# Patient Record
Sex: Female | Born: 1985
Health system: Southern US, Community
[De-identification: ages and names within clinical notes are randomized; demographics above are authoritative.]

## PROBLEM LIST (undated history)

## (undated) DIAGNOSIS — O139 Gestational [pregnancy-induced] hypertension without significant proteinuria, unspecified trimester: Secondary | ICD-10-CM

## (undated) DIAGNOSIS — F319 Bipolar disorder, unspecified: Secondary | ICD-10-CM

## (undated) DIAGNOSIS — D649 Anemia, unspecified: Secondary | ICD-10-CM

## (undated) DIAGNOSIS — IMO0002 Reserved for concepts with insufficient information to code with codable children: Secondary | ICD-10-CM

## (undated) DIAGNOSIS — N946 Dysmenorrhea, unspecified: Secondary | ICD-10-CM

## (undated) DIAGNOSIS — F99 Mental disorder, not otherwise specified: Secondary | ICD-10-CM

## (undated) DIAGNOSIS — O88219 Thromboembolism in pregnancy, unspecified trimester: Secondary | ICD-10-CM

## (undated) DIAGNOSIS — I2699 Other pulmonary embolism without acute cor pulmonale: Secondary | ICD-10-CM

## (undated) DIAGNOSIS — R87629 Unspecified abnormal cytological findings in specimens from vagina: Secondary | ICD-10-CM

## (undated) DIAGNOSIS — R102 Pelvic and perineal pain: Secondary | ICD-10-CM

## (undated) DIAGNOSIS — E039 Hypothyroidism, unspecified: Secondary | ICD-10-CM

## (undated) DIAGNOSIS — N920 Excessive and frequent menstruation with regular cycle: Secondary | ICD-10-CM

## (undated) DIAGNOSIS — N390 Urinary tract infection, site not specified: Secondary | ICD-10-CM

## (undated) HISTORY — DX: Reserved for concepts with insufficient information to code with codable children: IMO0002

## (undated) HISTORY — DX: Pelvic and perineal pain: R10.2

## (undated) HISTORY — DX: Unspecified abnormal cytological findings in specimens from vagina: R87.629

## (undated) HISTORY — PX: NO PAST SURGERIES: SHX2092

## (undated) HISTORY — DX: Dysmenorrhea, unspecified: N94.6

## (undated) HISTORY — DX: Excessive and frequent menstruation with regular cycle: N92.0

---
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
## 2006-03-19 ENCOUNTER — Encounter (INDEPENDENT_AMBULATORY_CARE_PROVIDER_SITE_OTHER): Payer: Self-pay | Admitting: Specialist

## 2006-03-19 ENCOUNTER — Ambulatory Visit (HOSPITAL_BASED_OUTPATIENT_CLINIC_OR_DEPARTMENT_OTHER): Admission: RE | Admit: 2006-03-19 | Discharge: 2006-03-19 | Payer: Self-pay | Admitting: Otolaryngology

## 2006-03-21 ENCOUNTER — Emergency Department (HOSPITAL_COMMUNITY): Admission: EM | Admit: 2006-03-21 | Discharge: 2006-03-21 | Payer: Self-pay | Admitting: Emergency Medicine

## 2007-06-02 ENCOUNTER — Emergency Department (HOSPITAL_COMMUNITY): Admission: EM | Admit: 2007-06-02 | Discharge: 2007-06-02 | Payer: Self-pay | Admitting: Emergency Medicine

## 2010-01-28 ENCOUNTER — Emergency Department: Payer: Self-pay | Admitting: Emergency Medicine

## 2010-08-26 NOTE — H&P (Signed)
NAME:  BRYNLEI, KLAUSNER               ACCOUNT NO.:  [id number]   MEDICAL RECORD NO.:  [id number]          PATIENT TYPE:  OBV   LOCATION:  A318                          FACILITY:  [facility]   PHYSICIAN:  Tesfaye D. Felecia Shelling, MD   DATE OF BIRTH:  05/30/1985   DATE OF ADMISSION:  01/03/2005  DATE OF DISCHARGE:  LH                                HISTORY & PHYSICAL   CHIEF COMPLAINT:  Rectal bleeding.   HISTORY OF PRESENT ILLNESS:  This is a 25 year old female with no  significant past medical history who came to the office with above  complaint.  Patient had abdominal discomfort and watery diarrhea for the  last 3 days.  Initially patient had abdominal discomfort which was followed  with recurrent watery diarrhea.  The diarrhea stopped since yesterday.  Patient developed rectal bleeding since today.  The blood is not mixed with  stool.  She had several times this morning.  She came to the office where  she was evaluated and rectal examination showed fresh blood on examining  finger.  Patient was admitted for 24 hour observation and GI evaluation.   REVIEW OF SYSTEMS:  Patient feels sometimes dizzy and generally weak.  Her  appetite is decreased.  She has abdominal discomfort.  No nausea, vomiting,  fever, chills, cough, chest pain, dysuria, urgency or frequency of  urination.   PAST MEDICAL HISTORY:  No significant past medical illness.   MEDICATIONS:  Patient is only on birth control pill.   SOCIAL HISTORY:  Patient is a high [employer].  No history of alcohol,  tobacco, or substance abuse.   PHYSICAL EXAMINATION:  GENERAL:  Patient is alert, awake and comfortable.  VITALS:  Blood pressure 126/76, pulse 84, respiratory rate 20, temperature  98.6 degrees Fahrenheit.  HEENT:  Pupils are equally reactive.  NECK:  Supple.  CHEST:  Clear lung fields.  Good air entry.  Cardiovascular system first and  second heart sound heard.  No murmur, no gallop.  ABDOMEN:  Soft and lax, bowel sounds  positive.  No mass, no organomegaly.  RECTAL:  Patient has a normal sphincter.  There is bright red blood on  examining finger.  No mass.  EXTREMITIES:  No leg edema.   LABORATORIES ON ADMISSION:  CBC:  WBC 6.9, hemoglobin 11.5, hematocrit 34.4,  platelets 302.  Sodium 137, potassium 3.4, chloride 106, carbon dioxide 25,  glucose 130, BUN 10, creatinine 1, bilirubin 0.3, alkaline phosphatase 59,  AST 19, ALT 19, total protein 6.6, albumin 3.2, and calcium 8.7.   ASSESSMENT:  This is a 25 year old female patient with no significant past  medical history admitted with rectal bleeding.  Patient has also recurrent  watery diarrhea which has stopped today.  Her hemoglobin and hematocrit is  almost within the normal limits.  She has mild hypokalemia.   PLAN:  We will keep the patient on observation for 24 hours.  We will repeat  CBC in the morning.  We will do GI consult.  We will slowly hydrate the  patient with D-5 1/2-normal at 75 mL/hr.  We will  start her on Protonix 40  mg IV piggyback daily.      Tesfaye D. Felecia Shelling, MD  Electronically Signed     TDF/MEDQ  D:  01/03/2005  T:  01/03/2005  Job:  [id number]

## 2010-08-26 NOTE — Consult Note (Signed)
Megan Suarez, Megan Suarez               ACCOUNT NO.:  [id number]   MEDICAL RECORD NO.:  [id number]          PATIENT TYPE:  OBV   LOCATION:  A318                          FACILITY:  [facility]   PHYSICIAN:  Lionel December, M.D.    DATE OF BIRTH:  19-Jan-1986   DATE OF CONSULTATION:  01/03/2005  DATE OF DISCHARGE:                                   CONSULTATION   PHYSICIAN REQUESTING CONSULTATION:  Tesfaye D. Felecia Shelling, MD   REASON FOR CONSULTATION:  Hematochezia.   HISTORY OF PRESENT ILLNESS:  The patient is a 25 year old African-American  female who presented to Dr. Letitia Neri office today with 3 episodes of  hematochezia this morning.  She was admitted observation for further  evaluation.  She states that she started her menstrual cycle Saturday.  Sunday and Monday she had several loose watery stools.  Today she had a soft  bowel movement and noted bright red blood on the toilet tissue.  As she was  having menstrual cycle, she thought this may have explained it.  However,  she has had 2 more bowel movements with blood in the toilet and on the  tissue even though she was using tampon.  She notes with this bleeding she  has low abdominal pain.  Denies any fever or chills or ill contacts.  No one  around her has been ill.  She has not been on any antibiotics lately.  She  has not traveled abroad.  She uses ibuprofen p.r.n. menstrual cramps.  Denies any nausea or vomiting, melena, anorexia.  Her weight has been  stable.  She takes birth control pills to try to regulate her menstrual  cycles.   Hemoglobin 11.5, hematocrit 34.3, MCV 81.3.  Potassium 3.4.  Albumin 3.2.   MEDICATIONS AT HOME:  Birth control pill daily and ibuprofen p.r.n.  menstrual cramps.   ALLERGIES:  No known drug allergies.   PAST MEDICAL HISTORY:  Negative for chronic illnesses.   PAST SURGICAL HISTORY:  Tonsillectomy.   FAMILY HISTORY:  Negative for chronic GI illnesses, colorectal cancer, or  inflammatory bowel disease.   SOCIAL HISTORY:  She is a [employer] attorney.  She has one brother.  She denies  any tobacco or alcohol use.   REVIEW OF SYSTEMS:  See HPI for GI.  CARDIOPULMONARY:  Denies any chest pain  or shortness of breath.  She had a syncopal episode about 4 weeks ago in  which EMS was notified.  She did not seek any other medical attention.   PHYSICAL EXAMINATION:  VITAL SIGNS:  Height 5 feet 5 inches, weight 145.5.  Temperature 98.9, pulse 96, respirations 20, blood pressure 132/77.  GENERAL:  Pleasant, well-nourished, well-developed black female in no acute  distress.  SKIN: Warm and dry, no jaundice.  HEENT:  Conjunctivae pink.  Sclerae nonicteric.  Oropharyngeal mucosa moist  and pink.  No lesions, erythema, or exudate.  NECK:  No lymphadenopathy or thyromegaly.  CHEST: Lungs are clear to auscultation.  CARDIAC:  Regular rate and rhythm.  Normal S1 and S2.  No murmurs, rubs, or  gallops.  ABDOMEN:  Positive bowel  sounds.  Soft, nontender, nondistended.  No  organomegaly or masses.  No rebound tenderness or guarding.  No abdominal  bruits or hernias.  RECTAL:  Examination deferred, performed by Dr. Felecia Shelling earlier today.  Found  to have fresh blood per rectum per patient.  EXTREMITIES:  No edema.   LABORATORY DATA:  As in HPI.  In addition, white count 5900, platelets  302,000.  Sodium 137, BUN 10, creatinine 1, glucose 130.  Total bilirubin  0.3, alkaline phosphatase 59, AST 19, ALT 10, albumin 3.2, calcium 8.7.   IMPRESSION:  The patient is a 25 year old female with 3 episodes of fresh  blood per rectum today associated with abdominal pain.  She did have  diarrhea for about 2 days preceding this and is also in her menstrual cycle.  Differential diagnoses include infectious colitis versus inflammatory bowel  disease versus bleeding from a benign anorectal source.  She is also noted  to have a mild microcytic anemia which may be due to chronic blood loss due  to her menses.    RECOMMENDATIONS:  1.  Follow hemoglobin and hematocrit.  2.  Stool studies including C. difficile, culture, O&P, and fecal wbc's.  3.  Further recommendations to follow.      Tana Coast, P.A.      Lionel December, M.D.  Electronically Signed    LL/MEDQ  D:  01/03/2005  T:  01/03/2005  Job:  [id number]   cc:   Tesfaye D. Felecia Shelling, MD  Fax: [phone]

## 2010-08-26 NOTE — Op Note (Signed)
Megan Suarez, Megan Suarez               ACCOUNT NO.:  [id number]   MEDICAL RECORD NO.:  [id number]          PATIENT TYPE:  AMB   LOCATION:  DSC                          FACILITY:  [facility]   PHYSICIAN:  Jefry H. Pollyann Kennedy, MD     DATE OF BIRTH:  1985/11/16   DATE OF PROCEDURE:  03/19/2006  DATE OF DISCHARGE:                               OPERATIVE REPORT   PREOPERATIVE DIAGNOSIS:  Chronic tonsillitis.   POSTOPERATIVE DIAGNOSIS:  Chronic tonsillitis.   PROCEDURE:  Tonsillectomy.   SURGEON:  Pollyann Kennedy.   ANESTHESIA:  General endotracheal anesthesia was used.   COMPLICATIONS:  None.   FINDINGS:  Enlarged tonsils without any pathologic findings today.  The  nasopharynx was clear of lymphoid tissue.   REFERRING PHYSICIAN:  Dr. Felecia Shelling.   BLOOD LOSS:  None.   HISTORY:  A 25 year old with a longstanding history of chronic and  recurrent tonsillopharyngitis.  Risks, benefits, alternatives and  complications of the procedure were explained to the patient, seemed to  understand and agreed to surgery.   DESCRIPTION OF PROCEDURE:  The patient was taken to the operating room,  placed on the operating table in supine position.  Following induction  of general endotracheal anesthesia the table was turned and the patient  was draped in a standard fashion.  The Crowe-Davis mouth gag was  inserted into the oral cavity, used to retract the tongue and mandible  attached to the Mayo stand.  Inspection of the palate revealed no  evidence of a submucosal cleft or shortening of the soft palate.  Red  rubber catheter was inserted into the right side of the nose, withdrawn  through the mouth and used to retract the soft palate and uvula.  Indirect exam of the nasopharynx was performed and no abnormal tissue  was identified.  Tonsillectomy was performed using electrocautery  dissection carefully dissecting avascular plane between the capsule the  constrictor muscles.  Spot cautery was used for completion of  hemostasis.  The tonsils were together for pathologic evaluation.  The  pharynx was suctioned of blood and secretions, irrigated with a saline  solution and an orogastric tube was used to aspirate the contents of the  stomach.  The patient was then awakened, extubated and transferred to  recovery in stable condition.      Jefry H. Pollyann Kennedy, MD  Electronically Signed     JHR/MEDQ  D:  03/19/2006  T:  03/20/2006  Job:  [id number]   cc:   Tesfaye D. Felecia Shelling, MD

## 2010-08-26 NOTE — Op Note (Signed)
Megan Suarez, Megan Suarez               ACCOUNT NO.:  [id number]   MEDICAL RECORD NO.:  [id number]          PATIENT TYPE:  OBV   LOCATION:  A318                          FACILITY:  [facility]   PHYSICIAN:  Lionel December, M.D.    DATE OF BIRTH:  05-22-1985   DATE OF PROCEDURE:  01/04/2005  DATE OF DISCHARGE:  01/04/2005                                 OPERATIVE REPORT   PROCEDURE:  Colonoscopy with terminal ileoscopy.   Nakeya is a 25 year old Afro-American female who presents with acute onset  of rectal bleeding. She had self-limiting diarrhea for two days prior to  this. She is undergoing diagnostic examination. Her H&H this morning was  11.4 and yesterday was 11.5. Procedure risks were reviewed with the patient  as well as the parents, and informed consent was obtained from her mother,  Ms. Nicholes Rough.   PREMEDICATION:  Demerol 50 mg IV, Versed 5 mg IV in divided dose.   FINDINGS:  Procedure performed in endoscopy suite. The patient's vital signs  and O2 saturation were monitored during the procedure and remained stable.  The patient was placed in left lateral position and rectal examination  performed. No abnormality noted on external or digital exam. Olympus  videoscope was placed in rectum and advanced under vision into sigmoid colon  and beyond. Preparation was excellent. Scope was passed to the cecum which  was identified by ileocecal valve and appendiceal orifice. Pictures taken  for the record. Scope was passed to TI. Distal 5-6 cm of mucosa revealed  loss of vascularity, hyperemia with multiple red spots as well as friability  and tiny erosions. The mucosa proximal to this segment revealed some tiny  nodules with erosions. Pictures taken for the record followed by biopsy.  Mucosa of the colon was examined on the way out and was normal throughout.  Rectal mucosa was normal. Scope was retroflexed to examine anorectal  junction which was unremarkable. Endoscope was straightened and  withdrawn.  The patient tolerated the procedure well.   FINAL DIAGNOSIS:  Mild terminal or focal ileitis, possibly an infectious  process rather than Crohn's disease.   RECOMMENDATIONS:  1.  Would advance the diet to regular diet.  2.  Levsin SL t.i.d.   As discussed with Dr. Felecia Shelling, the patient should be able to go home this  afternoon, and I will be following up on the biopsy results and further  recommendations.      Lionel December, M.D.  Electronically Signed    NR/MEDQ  D:  01/04/2005  T:  01/05/2005  Job:  [id number]

## 2010-12-30 LAB — URINE CULTURE
Colony Count: NO GROWTH
Culture: NO GROWTH

## 2010-12-30 LAB — I-STAT 8, (EC8 V) (CONVERTED LAB)
Bicarbonate: 24.4 — ABNORMAL HIGH
Glucose, Bld: 118 — ABNORMAL HIGH
HCT: 39
Hemoglobin: 13.3
Potassium: 3.3 — ABNORMAL LOW
TCO2: 26
pH, Ven: 7.336 — ABNORMAL HIGH

## 2010-12-30 LAB — DIFFERENTIAL
Basophils Absolute: 0
Lymphocytes Relative: 25
Lymphs Abs: 1.7
Monocytes Absolute: 0.3
Monocytes Relative: 4

## 2010-12-30 LAB — URINALYSIS, ROUTINE W REFLEX MICROSCOPIC
Bilirubin Urine: NEGATIVE
Glucose, UA: NEGATIVE
Hgb urine dipstick: NEGATIVE
Ketones, ur: 15 — AB
Protein, ur: NEGATIVE
Specific Gravity, Urine: 1.018
Urobilinogen, UA: 0.2

## 2010-12-30 LAB — RAPID URINE DRUG SCREEN, HOSP PERFORMED
Benzodiazepines: POSITIVE — AB
Cocaine: NOT DETECTED
Opiates: NOT DETECTED
Tetrahydrocannabinol: NOT DETECTED

## 2010-12-30 LAB — ETHANOL: Alcohol, Ethyl (B): 218 — ABNORMAL HIGH

## 2010-12-30 LAB — PREGNANCY, URINE: Preg Test, Ur: NEGATIVE

## 2010-12-30 LAB — CBC
MCV: 83.4
WBC: 7

## 2010-12-30 LAB — URINE MICROSCOPIC-ADD ON

## 2010-12-30 LAB — POCT I-STAT CREATININE: Creatinine, Ser: 1.1

## 2011-04-11 DIAGNOSIS — F319 Bipolar disorder, unspecified: Secondary | ICD-10-CM

## 2011-04-11 HISTORY — DX: Bipolar disorder, unspecified: F31.9

## 2012-07-17 ENCOUNTER — Ambulatory Visit: Payer: Self-pay | Admitting: Obstetrics & Gynecology

## 2012-08-15 ENCOUNTER — Ambulatory Visit: Payer: Self-pay | Admitting: Obstetrics & Gynecology

## 2014-04-10 DIAGNOSIS — O88219 Thromboembolism in pregnancy, unspecified trimester: Secondary | ICD-10-CM

## 2014-04-10 HISTORY — DX: Thromboembolism in pregnancy, unspecified trimester: O88.219

## 2014-06-30 ENCOUNTER — Encounter: Payer: Self-pay | Admitting: Women's Health

## 2014-10-15 ENCOUNTER — Encounter: Payer: Self-pay | Admitting: Adult Health

## 2014-10-15 ENCOUNTER — Ambulatory Visit (INDEPENDENT_AMBULATORY_CARE_PROVIDER_SITE_OTHER): Payer: 59 | Admitting: Adult Health

## 2014-10-15 VITALS — BP 134/90 | HR 80 | Ht 66.0 in | Wt 198.0 lb

## 2014-10-15 DIAGNOSIS — N941 Dyspareunia: Secondary | ICD-10-CM

## 2014-10-15 DIAGNOSIS — IMO0002 Reserved for concepts with insufficient information to code with codable children: Secondary | ICD-10-CM

## 2014-10-15 DIAGNOSIS — R102 Pelvic and perineal pain: Secondary | ICD-10-CM

## 2014-10-15 DIAGNOSIS — Z3202 Encounter for pregnancy test, result negative: Secondary | ICD-10-CM

## 2014-10-15 DIAGNOSIS — N946 Dysmenorrhea, unspecified: Secondary | ICD-10-CM

## 2014-10-15 DIAGNOSIS — N920 Excessive and frequent menstruation with regular cycle: Secondary | ICD-10-CM | POA: Diagnosis not present

## 2014-10-15 HISTORY — DX: Pelvic and perineal pain: R10.2

## 2014-10-15 HISTORY — DX: Excessive and frequent menstruation with regular cycle: N92.0

## 2014-10-15 HISTORY — DX: Dysmenorrhea, unspecified: N94.6

## 2014-10-15 HISTORY — DX: Reserved for concepts with insufficient information to code with codable children: IMO0002

## 2014-10-15 LAB — POCT URINALYSIS DIPSTICK

## 2014-10-15 LAB — POCT URINE PREGNANCY: PREG TEST UR: NEGATIVE

## 2014-10-15 MED ORDER — NORETHIN ACE-ETH ESTRAD-FE 1-20 MG-MCG(24) PO CHEW: 1.0000 | CHEWABLE_TABLET | Freq: Every day | ORAL | Status: DC

## 2014-10-15 NOTE — Progress Notes (Signed)
Subjective:     Patient ID: Megan Suarez, female   DOB: [birth year], 29 y.o.   MRN: [id number]  HPI Megan Suarez is a 29 year old black female in complaining of stomach/pelvic pain that radiates to back, is worse at ovulation and around period, but may hurt anytime.She says periods last 5-7 days and are heavy 3-4 days and has clots, has pain with sex sometimes.She said she started her period at age 29 and they were fine til about 4621 and has had discomfort since, has used OCs in past and was better.Has missed work due to pain.Sometimes BMs hurt but denies diarrhea or constipation.She says she wonders if cyst or endometriosis.She does use condoms.  Review of Systems  Patient denies any headaches, hearing loss, fatigue, blurred vision, shortness of breath, chest pain, a problems with bowel movements, urination.  No joint pain or mood swings.See HPI for positives.  Reviewed past medical,surgical, social and family history. Reviewed medications and allergies.     Objective:   Physical Exam BP 134/90 mmHg  Pulse 80  Ht 5\' 6"  (1.676 m)  Wt 198 lb (89.812 kg)  BMI 31.97 kg/m2  LMP 10/12/2014 BP recheck 128/80 UPT negative, urine 3+ blood and trace protein, Skin warm and dry. Neck: mid line trachea, normal thyroid, good ROM, no lymphadenopathy noted. Lungs: clear to ausculation bilaterally. Cardiovascular: regular rate and rhythm.Abdomen soft, no massed, tender just below navel. Pelvic: external genitalia is normal in appearance no lesions, vagina: period blood without odor,urethra has no lesions or masses noted, cervix:smooth, uterus: normal size, shape and contour, mildly tender, no masses felt, adnexa: no masses, mildly tenderness noted. Bladder is non tender and no masses felt. GC/CHL obtained.    Discussed will get US to assess and start her on OCs now and she agrees.  Assessment:     Pelvic pain Menorrhagia Dysmenorrhea Dyspareunia     Plan:     Check CBC,CMP,TSH and GC/CHL Return in 1  week for gyn US and then see me 3-4 days later for pap and physical Review handout on pelvic pain, menorrhagia and dyspareunia Rx minastrin disp 1 pack take 1 daily with 11 refills, given 1 pack to start today, lot [id number] A exp 6/17 Use condoms

## 2014-10-15 NOTE — Patient Instructions (Signed)
Dyspareunia Dyspareunia is pain during sexual intercourse. It is most common in women, but it also happens in men.  CAUSES  Female The pain from this condition is usually felt when anything is put into the vagina, but any part of the genitals may cause pain during sex. Even sitting or wearing pants can cause pain. Sometimes, a cause cannot be found. Some causes of pain during intercourse are:  Infections of the skin around the vagina.  Vaginal infections, such as a yeast, bacterial, or viral infection.  Vaginismus. This is the inability to have anything put in the vagina even when the woman wants it to happen. There is an automatic muscle contraction and pain. The pain of the muscle contraction can be so severe that intercourse is impossible.  Allergic reaction from spermicides, semen, condoms, scented tampons, soaps, douches, and vaginal sprays.  A fluid-filled sac (cyst) on the Bartholin or Skene glands, located at the opening of the vagina.  Scar tissue in the vagina from a surgically enlarged opening (episiotomy) or tearing after delivering a baby.  Vaginal dryness. This is more common in menopause. The normal secretions of the vagina are decreased. Changes in estrogen levels and increased difficulty becoming aroused can cause painful sex. Vaginal dryness can also happen when taking birth control pills.  Thinning of the tissue (atrophy) of the vulva and vagina. This makes the area thinner, smaller, unable to stretch to accommodate a penis, and prone to infection and tearing.  Vulvar vestibulitis or vestibulodynia.This is a condition that causes pain involving the area around the entrance to the vagina.The most common cause in young women is birth control pills.Women with low estrogen levels (postmenopausal women) may also experience this.Other causes include allergic reactions, too many nerve endings, skin conditions, and pelvic muscles that cannot relax.  Vulvar dermatoses. This  includes skin conditions such as lichen sclerosus and lichen planus.  Lack of foreplay to lubricate the vagina. This can cause vaginal dryness.  Noncancerous tumors (fibroids) in the uterus.  Uterus lining tissue growing outside the uterus (endometriosis).  Pregnancy that starts in the fallopian tube (tubal pregnancy).  Pregnancy or breastfeeding your baby. This can cause vaginal dryness.  A tilting or prolapse of the uterus. Prolapse is when weak and stretched muscles around the uterus allow it to fall into the vagina.  Problems with the ovaries, cysts, or scar tissue. This may be worse with certain sexual positions.  Previous surgeries causing adhesions or scar tissue in the vagina or pelvis.  Bladder and intestinal problems.  Psychological problems (such as depression or anxiety). This may make pain worse.  Negative attitudes about sex, experiencing rape, sexual assault, and misinformation about sex. These issues are often related to some types of pain.  Previous pelvic infection, causing scar tissue in the pelvis and on the female organs.  Cyst or tumor on the ovary.  Cancer of the female organs.  Certain medicines.  Medical problems such as diabetes, arthritis, or thyroid disease. Female In men, there are many physical causes of sexual discomfort. Some causes of pain during intercourse are:  Infections of the prostate, bladder, or seminal vesicles. This can cause pain after ejaculation.  An inflamed bladder (interstitial cystitis). This may cause pain from ejaculation.  Gonorrheal infections. This may cause pain during ejaculation.  An inflamed urethra (urethritis) or inflamed prostate (prostatitis). This can make genital stimulation painful or uncomfortable.  Deformities of the penis, such as Peyronie's disease.  A tight foreskin.  Cancer of the female organs.    Psychological problems. This may make pain worse. DIAGNOSIS   Your caregiver will take a history and  have you describe where the pain is located (outside the vagina, in the vagina, in the pelvis). You may be asked when you experience pain, such as with penetration or with thrusting.  Following this, your caregiver will do a physical exam. Let your caregiver know if the exam is too painful.  During the final part of the female exam, your caregiver will feel your uterus and ovaries with one hand on the abdomen and one finger in your vagina. This is a pelvic exam.  Blood tests, a Pap test, cultures for infection, an ultrasound test, and X-rays may be done. You may need to see a specialist for female problems (gynecologist).  Your caregiver may do a CT scan, MRI, or laparoscopy. Laparoscopy is a procedure to look into the pelvis with a lighted tube, through a cut (incision) in the abdomen. TREATMENT  Your caregiver can help you determine the best course of treatment. Sometimes, more testing is done. Continue with the suggested testing until your caregiver feels sure about your diagnosis and how to treat it. Sometimes, it is difficult to find the reason for the pain. The search for the cause and treatment can be frustrating. Treatment often takes several weeks to a few months before you notice any improvement. You may also need to avoid sexual activity until symptoms improve.Continuing to have sex when it hurts can delay healing and actually make the problem worse. The treatment depends on the cause of the pain. Treatment may include:  Medicines such as antibiotics, vaginal or skin creams, hormones, or antidepressants.  Minor or major surgery.  Psychological counseling or group therapy.  Kegel exercises and vaginal dilators to help certain cases of vaginismus (spasms). Do this only if recommended by your caregiver.Kegel exercises can make some problems worse.  Applying lubrication as recommended by your caregiver if you have dryness.  Sex therapy for you and your sex partner. It is common for  the pain to continue after the reason for the pain has been treated. Some reasons for this include a conditioned response. This means the person having the pain becomes so familiar with the pain that the pain continues as a response, even though the cause is removed. Sex therapy can help with this problem. HOME CARE INSTRUCTIONS   Follow your caregiver's instructions about taking medicines, tests, counseling, and follow-up treatment.  Do not use scented tampons, douches, vaginal sprays, or soaps.  Use water-based lubricants for dryness. Oil lubricants can cause irritation.  Do not use spermicides or condoms that irritate you.  Openly discuss with your partner your sexual experience, your desires, foreplay, and different sexual positions for a more comfortable and enjoyable sexual relationship.  Join group sessions for therapy, if needed.  Practice safe sex at all times.  Empty your bladder before having intercourse.  Try different positions during sexual intercourse.  Take over-the-counter pain medicine recommended by your caregiver before having sexual intercourse.  Do not wear pantyhose. Knee-high and thigh-high hose are okay.  Avoid scrubbing your vulva with a washcloth. Wash the area gently and pat dry with a towel. SEEK MEDICAL CARE IF:   You develop vaginal bleeding after sexual intercourse.  You develop a lump at the opening of your vagina, even if it is not painful.  You have abnormal vaginal discharge.  You have vaginal dryness.  You have itching or irritation of the vulva or vagina.  You   develop a rash or reaction to your medicine. SEEK IMMEDIATE MEDICAL CARE IF:   You develop severe abdominal pain during or shortly after sexual intercourse. You could have a ruptured ovarian cyst or ruptured tubal pregnancy.  You have a fever.  You have painful or bloody urination.  You have painful sexual intercourse, and you never had it before.  You pass out after having  sexual intercourse. Document Released: 04/16/2007 Document Revised: 06/19/2011 Document Reviewed: 06/27/2010 [facility] Patient Information 2015 [place], [place]. This information is not intended to replace advice given to you by your health care provider. Make sure you discuss any questions you have with your health care provider. Menorrhagia Menorrhagia is the medical term for when your menstrual periods are heavy or last longer than usual. With menorrhagia, every period you have may cause enough blood loss and cramping that you are unable to maintain your usual activities. CAUSES  In some cases, the cause of heavy periods is unknown, but a number of conditions may cause menorrhagia. Common causes include:  A problem with the hormone-producing thyroid gland (hypothyroid).  Noncancerous growths in the uterus (polyps or fibroids).  An imbalance of the estrogen and progesterone hormones.  One of your ovaries not releasing an egg during one or more months.  Side effects of having an intrauterine device (IUD).  Side effects of some medicines, such as anti-inflammatory medicines or blood thinners.  A bleeding disorder that stops your blood from clotting normally. SIGNS AND SYMPTOMS  During a normal period, bleeding lasts between 4 and 8 days. Signs that your periods are too heavy include:  You routinely have to change your pad or tampon every 1 or 2 hours because it is completely soaked.  You pass blood clots larger than 1 inch (2.5 cm) in size.  You have bleeding for more than 7 days.  You need to use pads and tampons at the same time because of heavy bleeding.  You need to wake up to change your pads or tampons during the night.  You have symptoms of anemia, such as tiredness, fatigue, or shortness of breath. DIAGNOSIS  Your health care provider will perform a physical exam and ask you questions about your symptoms and menstrual history. Other tests may be ordered based on what the  health care provider finds during the exam. These tests can include:  Blood tests. Blood tests are used to check if you are pregnant or have hormonal changes, a bleeding or thyroid disorder, low iron levels (anemia), or other problems.  Endometrial biopsy. Your health care provider takes a sample of tissue from the inside of your uterus to be examined under a microscope.  Pelvic ultrasound. This test uses sound waves to make a picture of your uterus, ovaries, and vagina. The pictures can show if you have fibroids or other growths.  Hysteroscopy. For this test, your health care provider will use a small telescope to look inside your uterus. Based on the results of your initial tests, your health care provider may recommend further testing. TREATMENT  Treatment may not be needed. If it is needed, your health care provider may recommend treatment with one or more medicines first. If these do not reduce bleeding enough, a surgical treatment might be an option. The best treatment for you will depend on:   Whether you need to prevent pregnancy.  Your desire to have children in the future.  The cause and severity of your bleeding.  Your opinion and personal preference.  Medicines for  menorrhagia may include:  Birth control methods that use hormones. These include the pill, skin patch, vaginal ring, shots that you get every 3 months, hormonal IUD, and implant. These treatments reduce bleeding during your menstrual period.  Medicines that thicken blood and slow bleeding.  Medicines that reduce swelling, such as ibuprofen.  Medicines that contain a synthetic hormone called progestin.   Medicines that make the ovaries stop working for a short time.  You may need surgical treatment for menorrhagia if the medicines are unsuccessful. Treatment options include:  Dilation and curettage (D&C). In this procedure, your health care provider opens (dilates) your cervix and then scrapes or suctions  tissue from the lining of your uterus to reduce menstrual bleeding.  Operative hysteroscopy. This procedure uses a tiny tube with a light (hysteroscope) to view your uterine cavity and can help in the surgical removal of a polyp that may be causing heavy periods.  Endometrial ablation. Through various techniques, your health care provider permanently destroys the entire lining of your uterus (endometrium). After endometrial ablation, most women have little or no menstrual flow. Endometrial ablation reduces your ability to become pregnant.  Endometrial resection. This surgical procedure uses an electrosurgical wire loop to remove the lining of the uterus. This procedure also reduces your ability to become pregnant.  Hysterectomy. Surgical removal of the uterus and cervix is a permanent procedure that stops menstrual periods. Pregnancy is not possible after a hysterectomy. This procedure requires anesthesia and hospitalization. HOME CARE INSTRUCTIONS   Only take over-the-counter or prescription medicines as directed by your health care provider. Take prescribed medicines exactly as directed. Do not change or switch medicines without consulting your health care provider.  Take any prescribed iron pills exactly as directed by your health care provider. Long-term heavy bleeding may result in low iron levels. Iron pills help replace the iron your body lost from heavy bleeding. Iron may cause constipation. If this becomes a problem, increase the bran, fruits, and roughage in your diet.  Do not take aspirin or medicines that contain aspirin 1 week before or during your menstrual period. Aspirin may make the bleeding worse.  If you need to change your sanitary pad or tampon more than once every 2 hours, stay in bed and rest as much as possible until the bleeding stops.  Eat well-balanced meals. Eat foods high in iron. Examples are leafy green vegetables, meat, liver, eggs, and whole grain breads and  cereals. Do not try to lose weight until the abnormal bleeding has stopped and your blood iron level is back to normal. SEEK MEDICAL CARE IF:   You soak through a pad or tampon every 1 or 2 hours, and this happens every time you have a period.  You need to use pads and tampons at the same time because you are bleeding so much.  You need to change your pad or tampon during the night.  You have a period that lasts for more than 8 days.  You pass clots bigger than 1 inch wide.  You have irregular periods that happen more or less often than once a month.  You feel dizzy or faint.  You feel very weak or tired.  You feel short of breath or feel your heart is beating too fast when you exercise.  You have nausea and vomiting or diarrhea while you are taking your medicine.  You have any problems that may be related to the medicine you are taking. SEEK IMMEDIATE MEDICAL CARE IF:  You soak through 4 or more pads or tampons in 2 hours.  You have any bleeding while you are pregnant. MAKE SURE YOU:   Understand these instructions.  Will watch your condition.  Will get help right away if you are not doing well or get worse. Document Released: 03/27/2005 Document Revised: 04/01/2013 Document Reviewed: 09/15/2012 [facility] Patient Information 2015 [place]. This information is not intended to replace advice given to you by your health care provider. Make sure you discuss any questions you have with your health care provider. Pelvic Pain Female pelvic pain can be caused by many different things and start from a variety of places. Pelvic pain refers to pain that is located in the lower half of the abdomen and between your hips. The pain may occur over a short period of time (acute) or may be reoccurring (chronic). The cause of pelvic pain may be related to disorders affecting the female reproductive organs (gynecologic), but it may also be related to the bladder, kidney stones, an  intestinal complication, or muscle or skeletal problems. Getting help right away for pelvic pain is important, especially if there has been severe, sharp, or a sudden onset of unusual pain. It is also important to get help right away because some types of pelvic pain can be life threatening.  CAUSES  Below are only some of the causes of pelvic pain. The causes of pelvic pain can be in one of several categories.   Gynecologic.  Pelvic inflammatory disease.  Sexually transmitted infection.  Ovarian cyst or a twisted ovarian ligament (ovarian torsion).  Uterine lining that grows outside the uterus (endometriosis).  Fibroids, cysts, or tumors.  Ovulation.  Pregnancy.  Pregnancy that occurs outside the uterus (ectopic pregnancy).  Miscarriage.  Labor.  Abruption of the placenta or ruptured uterus.  Infection.  Uterine infection (endometritis).  Bladder infection.  Diverticulitis.  Miscarriage related to a uterine infection (septic abortion).  Bladder.  Inflammation of the bladder (cystitis).  Kidney stone(s).  Gastrointestinal.  Constipation.  Diverticulitis.  Neurologic.  Trauma.  Feeling pelvic pain because of mental or emotional causes (psychosomatic).  Cancers of the bowel or pelvis. EVALUATION  Your caregiver will want to take a careful history of your concerns. This includes recent changes in your health, a careful gynecologic history of your periods (menses), and a sexual history. Obtaining your family history and medical history is also important. Your caregiver may suggest a pelvic exam. A pelvic exam will help identify the location and severity of the pain. It also helps in the evaluation of which organ system may be involved. In order to identify the cause of the pelvic pain and be properly treated, your caregiver may order tests. These tests may include:   A pregnancy test.  Pelvic ultrasonography.  An X-ray exam of the abdomen.  A urinalysis  or evaluation of vaginal discharge.  Blood tests. HOME CARE INSTRUCTIONS   Only take over-the-counter or prescription medicines for pain, discomfort, or fever as directed by your caregiver.   Rest as directed by your caregiver.   Eat a balanced diet.   Drink enough fluids to make your urine clear or pale yellow, or as directed.   Avoid sexual intercourse if it causes pain.   Apply warm or cold compresses to the lower abdomen depending on which one helps the pain.   Avoid stressful situations.   Keep a journal of your pelvic pain. Write down when it started, where the pain is located, and if there  are things that seem to be associated with the pain, such as food or your menstrual cycle.  Follow up with your caregiver as directed.  SEEK MEDICAL CARE IF:  Your medicine does not help your pain.  You have abnormal vaginal discharge. SEEK IMMEDIATE MEDICAL CARE IF:   You have heavy bleeding from the vagina.   Your pelvic pain increases.   You feel light-headed or faint.   You have chills.   You have pain with urination or blood in your urine.   You have uncontrolled diarrhea or vomiting.   You have a fever or persistent symptoms for more than 3 days.  You have a fever and your symptoms suddenly get worse.   You are being physically or sexually abused.  MAKE SURE YOU:  Understand these instructions.  Will watch your condition.  Will get help if you are not doing well or get worse. Document Released: 02/22/2004 Document Revised: 08/11/2013 Document Reviewed: 07/17/2011 [facility] Patient Information 2015 [place], [place]. This information is not intended to replace advice given to you by your health care provider. Make sure you discuss any questions you have with your health care provider. Return in 1 week for [place] then in 3-4 days for pap and physical  Start pills today and use condoms

## 2014-10-16 ENCOUNTER — Telehealth: Payer: Self-pay | Admitting: Adult Health

## 2014-10-16 ENCOUNTER — Encounter: Payer: Self-pay | Admitting: Adult Health

## 2014-10-16 LAB — COMPREHENSIVE METABOLIC PANEL
A/G RATIO: 1.6 (ref 1.1–2.5)
ALBUMIN: 4.2 g/dL (ref 3.5–5.5)
ALT: 11 IU/L (ref 0–32)
AST: 17 IU/L (ref 0–40)
Alkaline Phosphatase: 67 IU/L (ref 39–117)
BUN/Creatinine Ratio: 15 (ref 8–20)
BUN: 13 mg/dL (ref 6–20)
CO2: 23 mmol/L (ref 18–29)
Calcium: 9.2 mg/dL (ref 8.7–10.2)
Chloride: 103 mmol/L (ref 97–108)
Creatinine, Ser: 0.87 mg/dL (ref 0.57–1.00)
GFR, EST AFRICAN AMERICAN: 106 mL/min/{1.73_m2} (ref >59)
GFR, EST NON AFRICAN AMERICAN: 92 mL/min/{1.73_m2} (ref >59)
GLOBULIN, TOTAL: 2.7 g/dL (ref 1.5–4.5)
GLUCOSE: 106 mg/dL — AB (ref 65–99)
Potassium: 4.6 mmol/L (ref 3.5–5.2)
Sodium: 142 mmol/L (ref 134–144)
TOTAL PROTEIN: 6.9 g/dL (ref 6.0–8.5)

## 2014-10-16 LAB — CBC
Hematocrit: 39.5 % (ref 34.0–46.6)
Hemoglobin: 12.6 g/dL (ref 11.1–15.9)
MCH: 27.3 pg (ref 26.6–33.0)
MCHC: 31.9 g/dL (ref 31.5–35.7)
MCV: 86 fL (ref 79–97)
PLATELETS: 255 10*3/uL (ref 150–379)
RBC: 4.61 x10E6/uL (ref 3.77–5.28)
RDW: 14.3 % (ref 12.3–15.4)
WBC: 4.1 10*3/uL (ref 3.4–10.8)

## 2014-10-16 LAB — GC/CHLAMYDIA PROBE AMP
Chlamydia trachomatis, NAA: NEGATIVE
Neisseria gonorrhoeae by PCR: NEGATIVE

## 2014-10-16 LAB — TSH: TSH: 0.893 u[IU]/mL (ref 0.450–4.500)

## 2014-10-16 MED ORDER — IBUPROFEN 800 MG PO TABS: 800.0000 mg | ORAL_TABLET | Freq: Three times a day (TID) | ORAL | Status: DC | PRN

## 2014-10-16 NOTE — Telephone Encounter (Signed)
Having pain, started OCs yesterday, will rx motrin 800 mg 1 every 8 hours prn pain #60 with 1 refill

## 2014-10-16 NOTE — Telephone Encounter (Signed)
Left message to call me.

## 2014-10-21 ENCOUNTER — Encounter: Payer: Self-pay | Admitting: Advanced Practice Midwife

## 2014-10-21 ENCOUNTER — Telehealth: Payer: Self-pay | Admitting: Adult Health

## 2014-10-21 ENCOUNTER — Ambulatory Visit (INDEPENDENT_AMBULATORY_CARE_PROVIDER_SITE_OTHER): Payer: 59

## 2014-10-21 DIAGNOSIS — R102 Pelvic and perineal pain: Secondary | ICD-10-CM

## 2014-10-21 DIAGNOSIS — N920 Excessive and frequent menstruation with regular cycle: Secondary | ICD-10-CM

## 2014-10-21 DIAGNOSIS — N946 Dysmenorrhea, unspecified: Secondary | ICD-10-CM | POA: Diagnosis not present

## 2014-10-21 NOTE — Progress Notes (Signed)
US PELVIC TV/TA : normal anteverted uterus,simple cyst rt ov 2.5 x 2.4 x 1.8cm,normal lt ov,small amount of simple cul de sac fluid,eec 5.804mm

## 2014-10-21 NOTE — Telephone Encounter (Signed)
Pt  Aware of US and that it is normal has simple cysts right ovary, keep taking OCs and will see next week for pap and physical

## 2014-10-28 ENCOUNTER — Other Ambulatory Visit: Payer: 59 | Admitting: Adult Health

## 2014-11-05 ENCOUNTER — Other Ambulatory Visit: Payer: 59 | Admitting: Adult Health

## 2014-11-10 ENCOUNTER — Telehealth: Payer: Self-pay | Admitting: Adult Health

## 2014-11-10 NOTE — Telephone Encounter (Signed)
Spoke with pt. Pt wanted birth control rx transferred to [facility]. I advised to call the [facility] and have them transfer rx. Pt voiced understanding. JSY

## 2014-11-10 NOTE — Telephone Encounter (Signed)
Spoke with pt. Pt is on her 1st pack of Minastrin. She has been on her period x 9 days. I advised it can be normal to have irregular periods when you start a new birth control. I advised to continue taking pill and hopefully period will get regulated soon. Advised to let us know if bleeding continues through 2nd pack or if bleeding gets heavy. Pt voiced understanding. JSY

## 2014-11-24 ENCOUNTER — Other Ambulatory Visit: Payer: 59 | Admitting: Adult Health

## 2014-11-24 ENCOUNTER — Encounter: Payer: Self-pay | Admitting: Adult Health

## 2015-01-06 ENCOUNTER — Encounter (HOSPITAL_COMMUNITY): Payer: Self-pay

## 2015-01-06 ENCOUNTER — Emergency Department (HOSPITAL_COMMUNITY): Payer: 59

## 2015-01-06 ENCOUNTER — Emergency Department (HOSPITAL_COMMUNITY)
Admission: EM | Admit: 2015-01-06 | Discharge: 2015-01-06 | Disposition: A | Discharge status: Home / Self Care | Payer: 59 | Attending: Emergency Medicine | Admitting: Emergency Medicine

## 2015-01-06 DIAGNOSIS — Z87891 Personal history of nicotine dependence: Secondary | ICD-10-CM | POA: Diagnosis not present

## 2015-01-06 DIAGNOSIS — G8929 Other chronic pain: Secondary | ICD-10-CM | POA: Diagnosis not present

## 2015-01-06 DIAGNOSIS — M25572 Pain in left ankle and joints of left foot: Secondary | ICD-10-CM | POA: Insufficient documentation

## 2015-01-06 DIAGNOSIS — Z8742 Personal history of other diseases of the female genital tract: Secondary | ICD-10-CM | POA: Diagnosis not present

## 2015-01-06 DIAGNOSIS — Z793 Long term (current) use of hormonal contraceptives: Secondary | ICD-10-CM | POA: Insufficient documentation

## 2015-01-06 NOTE — ED Notes (Signed)
Pt c/o pain in left ankle x 2 days.  Describes as a "tearing" sensation.  Denies injury.   Pt says took  ibuprofen prior to arrival.

## 2015-01-06 NOTE — Discharge Instructions (Signed)
Ankle Pain Ankle pain is a common symptom. The bones, cartilage, tendons, and muscles of the ankle joint perform a lot of work each day. The ankle joint holds your body weight and allows you to move around. Ankle pain can occur on either side or back of 1 or both ankles. Ankle pain may be sharp and burning or dull and aching. There may be tenderness, stiffness, redness, or warmth around the ankle. The pain occurs more often when a person walks or puts pressure on the ankle. CAUSES  There are many reasons ankle pain can develop. It is important to work with your caregiver to identify the cause since many conditions can impact the bones, cartilage, muscles, and tendons. Causes for ankle pain include:  Injury, including a break (fracture), sprain, or strain often due to a fall, sports, or a high-impact activity.  Swelling (inflammation) of a tendon (tendonitis).  Achilles tendon rupture.  Ankle instability after repeated sprains and strains.  Poor foot alignment.  Pressure on a nerve (tarsal tunnel syndrome).  Arthritis in the ankle or the lining of the ankle.  Crystal formation in the ankle (gout or pseudogout). DIAGNOSIS  A diagnosis is based on your medical history, your symptoms, results of your physical exam, and results of diagnostic tests. Diagnostic tests may include X-ray exams or a computerized magnetic scan (magnetic resonance imaging, MRI). TREATMENT  Treatment will depend on the cause of your ankle pain and may include:  Keeping pressure off the ankle and limiting activities.  Using crutches or other walking support (a cane or brace).  Using rest, ice, compression, and elevation.  Participating in physical therapy or home exercises.  Wearing shoe inserts or special shoes.  Losing weight.  Taking medications to reduce pain or swelling or receiving an injection.  Undergoing surgery. HOME CARE INSTRUCTIONS   Only take over-the-counter or prescription medicines for  pain, discomfort, or fever as directed by your caregiver.  Put ice on the injured area.  Put ice in a plastic bag.  Place a towel between your skin and the bag.  Leave the ice on for 15-20 minutes at a time, 03-04 times a day.  Keep your leg raised (elevated) when possible to lessen swelling.  Avoid activities that cause ankle pain.  Follow specific exercises as directed by your caregiver.  Record how often you have ankle pain, the location of the pain, and what it feels like. This information may be helpful to you and your caregiver.  Ask your caregiver about returning to work or sports and whether you should drive.  Follow up with your caregiver for further examination, therapy, or testing as directed. SEEK MEDICAL CARE IF:   Pain or swelling continues or worsens beyond 1 week.  You have an oral temperature above 102 F (38.9 C).  You are feeling unwell or have chills.  You are having an increasingly difficult time with walking.  You have loss of sensation or other new symptoms.  You have questions or concerns. MAKE SURE YOU:   Understand these instructions.  Will watch your condition.  Will get help right away if you are not doing well or get worse. Document Released: 09/14/2009 Document Revised: 06/19/2011 Document Reviewed: 09/14/2009 [facility] Patient Information 2015 [place], [place]. This information is not intended to replace advice given to you by your health care provider. Make sure you discuss any questions you have with your health care provider.   Your xrays are negative today for any bony injury or abnormality.  Continue using ibuprofen as this helped relieve your pain. I recommend applying ice packs to your ankle throughout the day.   Call Dr. Romeo Apple for further evaluation if you symptoms persist.

## 2015-01-07 NOTE — ED Provider Notes (Signed)
CSN: [id number]     Arrival date & time 01/06/15  1136 History   First MD Initiated Contact with Patient 01/06/15 1155     Chief Complaint  Patient presents with  . Ankle Pain     (Consider location/radiation/quality/duration/timing/severity/associated sxs/prior Treatment) The history is provided by the patient.   Megan Suarez is a 29 y.o. female with no significant past medical history and no recognized injury to her left ankle presenting with a pain in the medial left ankle for the past 2 days.  She describes a sharp "tearing" sensation at the medial left ankle which is worsened when she first wakes in the morning with ambulation, but improves after she has been awake and moving for awhile.  The pain radiates to her lower medial leg.  She denies swelling, redness, injury.  She took ibuprofen 800 mg today which has significantly improved her pain.  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  She has been seen in the past by an orthopedist for being "flat footed" as a teenager and used to wear special inserts which were helpful.     Past Medical History  Diagnosis Date  . Vaginal Pap smear, abnormal   . Pelvic pain in female 10/15/2014  . Dyspareunia 10/15/2014  . Menorrhagia with regular cycle 10/15/2014  . Dysmenorrhea 10/15/2014   History reviewed. No pertinent past surgical history. No family history on file. Social History  Substance Use Topics  . Smoking status: Former Smoker    Types: Cigarettes  . Smokeless tobacco: Never Used  . Alcohol Use: Yes     Comment: once-twice weekly   OB History    Gravida Para Term Preterm AB TAB SAB Ectopic Multiple Living       Review of Systems  Constitutional: Negative for fever.  Musculoskeletal: Positive for arthralgias. Negative for myalgias and joint swelling.  Skin:  Negative for color change, rash and wound.  Neurological: Negative for weakness and numbness.      Allergies  Review of patient's allergies indicates no known allergies.  Home Medications   Prior to Admission medications   Medication Sig Start Date End Date Taking? Authorizing Provider  Norethin Ace-Eth Estrad-FE (MINASTRIN 24 FE) 1-20 MG-MCG(24) CHEW Chew 1 tablet by mouth daily. 10/15/14  Yes Adline Potter, NP  ibuprofen (ADVIL,MOTRIN) 800 MG tablet Take 1 tablet (800 mg total) by mouth every 8 (eight) hours as needed. Patient not taking: Reported on 01/06/2015 10/16/14   Adline Potter, NP   BP 154/99 mmHg  Pulse 78  Temp(Src) 99.1 F (37.3 C) (Oral)  Resp 20  Ht  (1.676 m)  Wt 200 lb (90.719 kg)  BMI 32.30 kg/m2  SpO2 99%  LMP 01/04/2015 Physical Exam  Constitutional: She appears well-developed and well-nourished.  HENT:  Head: Atraumatic.  Neck: Normal range of motion.  Cardiovascular:  Pulses equal bilaterally  Musculoskeletal: She exhibits tenderness. She exhibits no edema.       Left ankle: She exhibits normal range of motion, no swelling, no ecchymosis, no deformity and normal pulse. Tenderness. Medial malleolus tenderness found. Achilles tendon normal.  Bilateral pes planus.  Neurological: She is alert. She has normal strength. She displays normal reflexes. No sensory deficit.  Skin: Skin is warm and dry.  Psychiatric:  She has a normal mood and affect.    ED Course  Procedures (including critical care time) Labs Review Labs Reviewed - No data to display  Imaging Review Dg Ankle 2 Views Left  01/06/2015   CLINICAL DATA:  Pain medial side of LEFT ankle for 2 days.  EXAM: LEFT ANKLE - 2 VIEW  COMPARISON:  None.  FINDINGS: Ankle mortise intact. The talar dome is normal. No malleolar fracture. The calcaneus is normal.  IMPRESSION: No fracture or dislocation.   Electronically Signed   By: Genevive Bi M.D.   On: 01/06/2015 13:46   I have personally  reviewed and evaluated these images and lab results as part of my medical decision-making.   EKG Interpretation None      MDM   Final diagnoses:  Ankle pain, chronic, left     Radiological studies were viewed, interpreted and considered during the medical decision making and disposition process. I agree with radiologists reading.  Results were also discussed with patient. She was placed in an aso which did offer some comfort. Advised to consider adding shoe insert providing better arch support and/or f/u with ortho for further eval of this problem.  Suspect she may have ligament strain secondary to increased standing and ambulation/overuse with her new job.  She was advised to continue ibuprofen, f/u as needed, referral given.     Burgess Amor, PA-C 01/07/15 1610  Burgess Amor, PA-C 01/07/15 1708  Blane Ohara, MD 01/12/15 [phone]

## 2015-01-21 ENCOUNTER — Encounter: Payer: Self-pay | Admitting: Orthopedic Surgery

## 2015-01-21 ENCOUNTER — Ambulatory Visit (INDEPENDENT_AMBULATORY_CARE_PROVIDER_SITE_OTHER): Payer: 59 | Admitting: Orthopedic Surgery

## 2015-01-21 VITALS — BP 125/81 | Ht 66.0 in | Wt 200.0 lb

## 2015-01-21 DIAGNOSIS — S93402A Sprain of unspecified ligament of left ankle, initial encounter: Secondary | ICD-10-CM | POA: Diagnosis not present

## 2015-01-21 NOTE — Progress Notes (Signed)
Patient ID: Megan Suarez, female   DOB: June 09, 1985, 29 y.o.   MRN: [id number]  Chief Complaint  Patient presents with  . Ankle Injury    er follow up left ankle pain, DOI 01/07/15    HPI Megan Suarez is a 29 y.o. female.  The patient got a bed something popped in her left ankle and she had pain and she's had it for 2 weeks. She complains of 5 out of 10 constant pain. She complains of sharp throbbing aching radiating pain in the left foot and ankle primarily on the medial side with some mild lateral pain and she also complains of stiffness  Review of systems she says sometimes she feels some numbness and tingling in the foot. She's taken Motrin worn ASO brace she said that didn't help but she has improved over the 2 week.  X-rays were taken they were normal  She presents for definitive management  Review of systems stiffness numbness tingling depression nausea constipation ankle leg swelling palpitations ringing of the ears and fatigue the other systems reviewed were normal  Review of Systems Review of Systems  Past Medical History  Diagnosis Date  . Vaginal Pap smear, abnormal   . Pelvic pain in female 10/15/2014  . Dyspareunia 10/15/2014  . Menorrhagia with regular cycle 10/15/2014  . Dysmenorrhea 10/15/2014    No past surgical history on file.  No family history on file.  Social History Social History  Substance Use Topics  . Smoking status: Former Smoker    Types: Cigarettes  . Smokeless tobacco: Never Used  . Alcohol Use: Yes     Comment: once-twice weekly    No Known Allergies  Current Outpatient Prescriptions  Medication Sig Dispense Refill  . ibuprofen (ADVIL,MOTRIN) 800 MG tablet Take 1 tablet (800 mg total) by mouth every 8 (eight) hours as needed. 60 tablet 1  . lamoTRIgine (LAMICTAL) 25 MG tablet Take 25 mg by mouth daily.    . Norethin Ace-Eth Estrad-FE (MINASTRIN 24 FE) 1-20 MG-MCG(24) CHEW Chew 1 tablet by mouth daily. 28 tablet 11   No current  facility-administered medications for this visit.       Physical Exam Physical Exam Blood pressure 125/81, height 5\' 6"  (1.676 m), weight 200 lb (90.719 kg), last menstrual period 01/04/2015. Appearance, there are no abnormalities in terms of appearance the patient was well-developed and well-nourished. The grooming and hygiene were normal.  Mental status orientation, there was normal alertness and orientation Mood pleasant Ambulatory status I watched her walk and she has a slight limp favoring the left side Examination of the right ankle no swelling normal range of motion no tenderness normal stability neurovascular exam intact Inspection of the left ankle reveal medial and lateral. Ankle tenderness Range of motion normal plantar flexion inversion eversion decreased dorsiflexion Tests for stability 1+ firm endpoint anterior drawer Motor strength  normal eversion Skin warm dry and intact without laceration or ulceration or erythema Neurologic examination normal sensation Vascular examination normal pulses with warm extremity and normal capillary refill      Data Reviewed X-rays independently interpreted as normal with no fracture  Assessment  Ankle sprain   Plan  I taught her how to do ankle stretching exercises. Return to work on the 17th. Continue Motrin. Follow-up as needed.

## 2015-01-21 NOTE — Patient Instructions (Signed)
Return to work 01/25/15  Home exercises for ankle 15 times each  Continue Ibuprofen

## 2015-03-11 ENCOUNTER — Encounter (HOSPITAL_COMMUNITY): Payer: Self-pay | Admitting: Emergency Medicine

## 2015-03-11 ENCOUNTER — Emergency Department (HOSPITAL_COMMUNITY): Payer: Self-pay

## 2015-03-11 ENCOUNTER — Observation Stay (HOSPITAL_COMMUNITY)
Admission: EM | Admit: 2015-03-11 | Discharge: 2015-03-12 | Disposition: A | Discharge status: Home / Self Care | Payer: Self-pay | Attending: Internal Medicine | Admitting: Internal Medicine

## 2015-03-11 DIAGNOSIS — N920 Excessive and frequent menstruation with regular cycle: Secondary | ICD-10-CM | POA: Insufficient documentation

## 2015-03-11 DIAGNOSIS — Z79899 Other long term (current) drug therapy: Secondary | ICD-10-CM | POA: Insufficient documentation

## 2015-03-11 DIAGNOSIS — R000 Tachycardia, unspecified: Secondary | ICD-10-CM | POA: Insufficient documentation

## 2015-03-11 DIAGNOSIS — I2699 Other pulmonary embolism without acute cor pulmonale: Principal | ICD-10-CM | POA: Diagnosis present

## 2015-03-11 DIAGNOSIS — Z87891 Personal history of nicotine dependence: Secondary | ICD-10-CM | POA: Insufficient documentation

## 2015-03-11 DIAGNOSIS — R109 Unspecified abdominal pain: Secondary | ICD-10-CM | POA: Insufficient documentation

## 2015-03-11 DIAGNOSIS — N946 Dysmenorrhea, unspecified: Secondary | ICD-10-CM | POA: Insufficient documentation

## 2015-03-11 NOTE — ED Notes (Signed)
Pt has worse SOB with talking, pain with deep breathes

## 2015-03-11 NOTE — ED Notes (Signed)
Pt complaining of left side pain, now radiating up into left shoulder, pt has increased SOB

## 2015-03-11 NOTE — ED Notes (Signed)
Pt states 2 days ago she began having pain in the left side/ribcage area. States she felt as though it was trapped gas at that time. Pt endorses that the pain worsens at night after work. States she has recently started a new more sedentary job as well. Pt states some shortness of breath has been associated with the pain tonight.

## 2015-03-12 ENCOUNTER — Emergency Department (HOSPITAL_COMMUNITY): Payer: 59

## 2015-03-12 DIAGNOSIS — R0602 Shortness of breath: Secondary | ICD-10-CM

## 2015-03-12 DIAGNOSIS — I2699 Other pulmonary embolism without acute cor pulmonale: Principal | ICD-10-CM | POA: Diagnosis present

## 2015-03-12 DIAGNOSIS — R071 Chest pain on breathing: Secondary | ICD-10-CM

## 2015-03-12 LAB — URINE MICROSCOPIC-ADD ON

## 2015-03-12 LAB — URINALYSIS, ROUTINE W REFLEX MICROSCOPIC
Bilirubin Urine: NEGATIVE
GLUCOSE, UA: NEGATIVE mg/dL
KETONES UR: NEGATIVE mg/dL
LEUKOCYTES UA: NEGATIVE
Nitrite: NEGATIVE
PROTEIN: NEGATIVE mg/dL
Specific Gravity, Urine: 1.01 (ref 1.005–1.030)
pH: 6 (ref 5.0–8.0)

## 2015-03-12 LAB — CBC WITH DIFFERENTIAL/PLATELET
BASOS ABS: 0 10*3/uL (ref 0.0–0.1)
BASOS PCT: 1 %
EOS ABS: 0.1 10*3/uL (ref 0.0–0.7)
Eosinophils Relative: 1 %
HCT: 38.1 % (ref 36.0–46.0)
Hemoglobin: 12.3 g/dL (ref 12.0–15.0)
Lymphocytes Relative: 25 %
Lymphs Abs: 1.8 10*3/uL (ref 0.7–4.0)
MCH: 28.2 pg (ref 26.0–34.0)
MCHC: 32.3 g/dL (ref 30.0–36.0)
MCV: 87.4 fL (ref 78.0–100.0)
MONO ABS: 0.6 10*3/uL (ref 0.1–1.0)
MONOS PCT: 9 %
NEUTROS PCT: 64 %
Neutro Abs: 4.8 10*3/uL (ref 1.7–7.7)
Platelets: 228 10*3/uL (ref 150–400)
RBC: 4.36 MIL/uL (ref 3.87–5.11)
RDW: 14 % (ref 11.5–15.5)
WBC: 7.3 10*3/uL (ref 4.0–10.5)

## 2015-03-12 LAB — POC URINE PREG, ED: PREG TEST UR: NEGATIVE

## 2015-03-12 LAB — COMPREHENSIVE METABOLIC PANEL
ALBUMIN: 3.7 g/dL (ref 3.5–5.0)
ALT: 17 U/L (ref 14–54)
ANION GAP: 7 (ref 5–15)
AST: 17 U/L (ref 15–41)
Alkaline Phosphatase: 61 U/L (ref 38–126)
BUN: 17 mg/dL (ref 6–20)
CHLORIDE: 102 mmol/L (ref 101–111)
CO2: 28 mmol/L (ref 22–32)
Calcium: 8.9 mg/dL (ref 8.9–10.3)
Creatinine, Ser: 1.06 mg/dL — ABNORMAL HIGH (ref 0.44–1.00)
GFR calc Af Amer: >60 mL/min (ref >60)
GFR calc non Af Amer: >60 mL/min (ref >60)
GLUCOSE: 107 mg/dL — AB (ref 65–99)
POTASSIUM: 3.6 mmol/L (ref 3.5–5.1)
SODIUM: 137 mmol/L (ref 135–145)
Total Bilirubin: 0.7 mg/dL (ref 0.3–1.2)
Total Protein: 7.6 g/dL (ref 6.5–8.1)

## 2015-03-12 LAB — TROPONIN I: Troponin I: <0.03 ng/mL (ref <0.031)

## 2015-03-12 LAB — D-DIMER, QUANTITATIVE: D-Dimer, Quant: 1.12 ug/mL-FEU — ABNORMAL HIGH (ref 0.00–0.50)

## 2015-03-12 LAB — ANTITHROMBIN III: ANTITHROMB III FUNC: 110 % (ref 75–120)

## 2015-03-12 MED ORDER — ONDANSETRON HCL 4 MG/2ML IJ SOLN
4.0000 mg | Freq: Once | INTRAMUSCULAR | Status: AC
  Administered 2015-03-12: 4 mg via INTRAVENOUS
  Filled 2015-03-12: qty 2

## 2015-03-12 MED ORDER — SODIUM CHLORIDE 0.9 % IV SOLN: 250.0000 mL | INTRAVENOUS | Status: DC | PRN

## 2015-03-12 MED ORDER — ENOXAPARIN SODIUM 100 MG/ML ~~LOC~~ SOLN
SUBCUTANEOUS | Status: AC
  Filled 2015-03-12: qty 1

## 2015-03-12 MED ORDER — RIVAROXABAN (XARELTO) VTE STARTER PACK (15 & 20 MG): ORAL_TABLET | ORAL | Status: DC

## 2015-03-12 MED ORDER — RIVAROXABAN 20 MG PO TABS: 20.0000 mg | ORAL_TABLET | Freq: Every day | ORAL | Status: DC

## 2015-03-12 MED ORDER — SODIUM CHLORIDE 0.9 % IJ SOLN
3.0000 mL | Freq: Two times a day (BID) | INTRAMUSCULAR | Status: DC
  Administered 2015-03-12: 3 mL via INTRAVENOUS

## 2015-03-12 MED ORDER — FENTANYL CITRATE (PF) 100 MCG/2ML IJ SOLN
50.0000 ug | Freq: Once | INTRAMUSCULAR | Status: AC
  Administered 2015-03-12: 50 ug via INTRAVENOUS
  Filled 2015-03-12: qty 2

## 2015-03-12 MED ORDER — SODIUM CHLORIDE 0.9 % IJ SOLN: 3.0000 mL | Freq: Two times a day (BID) | INTRAMUSCULAR | Status: DC

## 2015-03-12 MED ORDER — HYDROCODONE-ACETAMINOPHEN 5-325 MG PO TABS
1.0000 | ORAL_TABLET | ORAL | Status: DC | PRN
  Administered 2015-03-12: 1 via ORAL
  Filled 2015-03-12: qty 1

## 2015-03-12 MED ORDER — SODIUM CHLORIDE 0.9 % IJ SOLN: 3.0000 mL | INTRAMUSCULAR | Status: DC | PRN

## 2015-03-12 MED ORDER — ENOXAPARIN SODIUM 100 MG/ML ~~LOC~~ SOLN
1.0000 mg/kg | Freq: Once | SUBCUTANEOUS | Status: AC
  Administered 2015-03-12: 95 mg via SUBCUTANEOUS
  Filled 2015-03-12: qty 1

## 2015-03-12 MED ORDER — ENOXAPARIN SODIUM 100 MG/ML ~~LOC~~ SOLN: 1.0000 mg/kg | Freq: Two times a day (BID) | SUBCUTANEOUS | Status: DC

## 2015-03-12 MED ORDER — IOHEXOL 350 MG/ML SOLN
100.0000 mL | Freq: Once | INTRAVENOUS | Status: AC | PRN
  Administered 2015-03-12: 100 mL via INTRAVENOUS

## 2015-03-12 NOTE — Progress Notes (Signed)
Discharged PT per MD order and protocol. Reviewed discharge teaching and handouts given. Prescriptions given and explained.  Pt verbalized understanding and left with all belongings. VSS. IV catheter D/C.  Patient wheeled down by staff member. Lesly Dukesachel J Everett, RN

## 2015-03-12 NOTE — H&P (Signed)
PCP:   No PCP Per Patient   Chief Complaint:  Chest pain  HPI: 29 yo female healthy comes in with 2 days of pleuritic chest pain persistent with associated sob.  No fevers, chills, no cough or hemoptysis.  No recent surgery or traveling.  Was on birth control, stopped taking it about 2 months ago.  No recent immobility.  No recent broken bones.  cta shows new PE.  No prior h/o VTE.  Pt unsure if she has health insurance as she just changed jobs.  Review of Systems:  Positive and negative as per HPI otherwise all other systems are negative  Past Medical History: Past Medical History  Diagnosis Date  . Vaginal Pap smear, abnormal   . Pelvic pain in female 10/15/2014  . Dyspareunia 10/15/2014  . Menorrhagia with regular cycle 10/15/2014  . Dysmenorrhea 10/15/2014   History reviewed. No pertinent past surgical history.  Medications: Prior to Admission medications   Medication Sig Start Date End Date Taking? Authorizing Provider  ibuprofen (ADVIL,MOTRIN) 800 MG tablet Take 1 tablet (800 mg total) by mouth every 8 (eight) hours as needed. 10/16/14   Adline PotterJennifer A Griffin, NP  lamoTRIgine (LAMICTAL) 25 MG tablet Take 25 mg by mouth daily.    Historical Provider, MD  Norethin Ace-Eth Estrad-FE (MINASTRIN 24 FE) 1-20 MG-MCG(24) CHEW Chew 1 tablet by mouth daily. 10/15/14   Adline PotterJennifer A Griffin, NP    Allergies:  No Known Allergies  Social History:  reports that she has quit smoking. Her smoking use included Cigarettes. She has never used smokeless tobacco. She reports that she drinks alcohol. She reports that she does not use illicit drugs.  Family History: No vte  Physical Exam: Filed Vitals:   03/12/15 0130 03/12/15 0200 03/12/15 0227 03/12/15 0230  BP: 138/83 142/91 142/91 132/78  Pulse: 91  88 87  Temp:      TempSrc:      Resp: 20  19 20   Height:      Weight:      SpO2: 98%  99% 98%   General appearance: alert, cooperative and no distress Head: Normocephalic, without obvious  abnormality, atraumatic Eyes: negative Nose: Nares normal. Septum midline. Mucosa normal. No drainage or sinus tenderness. Neck: no JVD and supple, symmetrical, trachea midline Lungs: clear to auscultation bilaterally Heart: regular rate and rhythm, S1, S2 normal, no murmur, click, rub or gallop Abdomen: soft, non-tender; bowel sounds normal; no masses,  no organomegaly Extremities: extremities normal, atraumatic, no cyanosis or edema Pulses: 2+ and symmetric Skin: Skin color, texture, turgor normal. No rashes or lesions Neurologic: Grossly normal    Labs on Admission:   Recent Labs  03/11/15 2315  NA 137  K 3.6  CL 102  CO2 28  GLUCOSE 107*  BUN 17  CREATININE 1.06*  CALCIUM 8.9    Recent Labs  03/11/15 2315  AST 17  ALT 17  ALKPHOS 61  BILITOT 0.7  PROT 7.6  ALBUMIN 3.7    Recent Labs  03/11/15 2315  WBC 7.3  NEUTROABS 4.8  HGB 12.3  HCT 38.1  MCV 87.4  PLT 228    Recent Labs  03/11/15 2315  TROPONINI <0.03    Radiological Exams on Admission: Dg Chest 2 View  03/11/2015  CLINICAL DATA:  Dyspnea and left chest pain radiating to the back. EXAM: CHEST  2 VIEW COMPARISON:  None. FINDINGS: The heart size and mediastinal contours are within normal limits. Both lungs are clear. The visualized skeletal structures are  unremarkable. IMPRESSION: No active cardiopulmonary disease. Electronically Signed   By: Ellery Plunk M.D.   On: 03/11/2015 23:55   Ct Angio Chest Pe W/cm &/or Wo Cm  03/12/2015  CLINICAL DATA:  Left-sided chest pain of 2 days duration EXAM: CT ANGIOGRAPHY CHEST WITH CONTRAST TECHNIQUE: Multidetector CT imaging of the chest was performed using the standard protocol during bolus administration of intravenous contrast. Multiplanar CT image reconstructions and MIPs were obtained to evaluate the vascular anatomy. CONTRAST:  OMNIPAQUE IOHEXOL 350 MG/ML SOLN COMPARISON:  Radiographs 03/11/2015 FINDINGS: Cardiovascular: There is acute pulmonary  embolism to the segmental arteries of both lower lobes. Overall the volume of pulmonary embolus is moderately low. There is no CT evidence of right heart strain. The thoracic aorta is normal in caliber and intact. Lungs: There is peripheral a based focal consolidation in the posterior basal segment of the left lower lobe. This could represent infarction, given the pulmonary embolus in the feeding artery. It could also represent infectious infiltrate, aspiration or atelectasis. The lungs are otherwise clear. Central airways: Patent Effusions: Trace left pleural effusion. Lymphadenopathy: None Esophagus: Unremarkable Upper abdomen: No significant abnormality Musculoskeletal: No significant abnormality Review of the MIP images confirms the above findings. IMPRESSION: The study is positive for acute pulmonary embolism. The emboli involve segmental arteries of both lower lobes. No CT evidence of right heart strain. These results were called by telephone at the time of interpretation on 03/12/2015 at 2:02 am to Dr. Lynelle Doctor , who verbally acknowledged these results. Electronically Signed   By: Ellery Plunk M.D.   On: 03/12/2015 02:03    Assessment/Plan  29 yo female with unprovoked new bilateral pulmonary emboli  Principal Problem:   Pulmonary embolism and infarction (HCC)- given full dose lovenox already.  Will need full hypercoagulable work up once treatment is finished.  Will need anticoagulation for at least 6 months.  No estrogen.  obs pt overnight in order to figure out what line of therapy she will be able to afford pending her health insurance status.    Active Problems:   Chest pain- as above, due to PE   SOB (shortness of breath)- due to PE  obs on tele.  Full code.  Almadelia Looman A 03/12/2015, 3:17 AM

## 2015-03-12 NOTE — ED Notes (Signed)
Pt. Walked around the nurses station with no dizziness and no short of breath. Pt O2 was 98%. Pt stated that she only feels short of breath when she walks to fast then she cant catch her breath.

## 2015-03-12 NOTE — Discharge Summary (Signed)
Physician Discharge Summary  Megan Suarez RUE:[id number] DOB: [birth year] DOA: 03/11/2015  PCP: No PCP Per Patient  Admit date: 03/11/2015 Discharge date: 03/12/2015  Time spent: 45 minutes  Recommendations for Outpatient Follow-up:  -Will be discharged home today. -Case management has provided with financial resources for Xarelto and for follow-up with new PCP.   Discharge Diagnoses:  Principal Problem:   Pulmonary embolism and infarction [facility]) Active Problems:   Chest pain   SOB (shortness of breath)   Discharge Condition: Stable and improved  Filed Weights   03/11/15 2227  Weight: 93.895 kg (207 lb)    History of present illness:  As per Dr. Onalee Suarez 03/12/15: 29 yo female healthy comes in with 2 days of pleuritic chest pain persistent with associated sob. No fevers, chills, no cough or hemoptysis. No recent surgery or traveling. Was on birth control, stopped taking it about 2 months ago. No recent immobility. No recent broken bones. cta shows new PE. No prior h/o VTE. Pt unsure if she has health insurance as she just changed jobs.  Hospital Course:   Acute pulmonary embolism -No oxygen requirements, no evidence of right heart strain on CT scan. -Hypercoagulable workup is pending at time of discharge and will need to be followed up on. -We'll be discharged on Xarelto which she will need to take for at least 6 months.  Procedures:  None   Consultations:  None  Discharge Instructions  Discharge Instructions    Increase activity slowly    Complete by:  As directed             Medication List    TAKE these medications        ibuprofen 800 MG tablet  Commonly known as:  ADVIL,MOTRIN  Take 1 tablet (800 mg total) by mouth every 8 (eight) hours as needed.     lamoTRIgine 25 MG tablet  Commonly known as:  LAMICTAL  Take 25 mg by mouth daily.     Rivaroxaban 15 & 20 MG Tbpk  Commonly known as:  XARELTO STARTER PACK  Take as directed on package:  Start with one  tablet by mouth twice a day with food. On Day 22, switch to one  tablet once a day with food.     rivaroxaban 20 MG Tabs tablet  Commonly known as:  XARELTO  Take 1 tablet (20 mg total) by mouth daily with supper.       No Known Allergies     Follow-up Information    Schedule an appointment as soon as possible for a visit in 2 weeks to follow up.   Why:  with new primary care provider       The results of significant diagnostics from this hospitalization (including imaging, microbiology, ancillary and laboratory) are listed below for reference.    Significant Diagnostic Studies: Dg Chest 2 View  03/11/2015  CLINICAL DATA:  Dyspnea and left chest pain radiating to the back. EXAM: CHEST  2 VIEW COMPARISON:  None. FINDINGS: The heart size and mediastinal contours are within normal limits. Both lungs are clear. The visualized skeletal structures are unremarkable. IMPRESSION: No active cardiopulmonary disease. Electronically Signed   By: Ellery Plunk M.D.   On: 03/11/2015 23:55   Ct Angio Chest Pe W/cm &/or Wo Cm  03/12/2015  CLINICAL DATA:  Left-sided chest pain of 2 days duration EXAM: CT ANGIOGRAPHY CHEST WITH CONTRAST TECHNIQUE: Multidetector CT imaging of the chest was performed using the standard protocol during bolus  administration of intravenous contrast. Multiplanar CT image reconstructions and MIPs were obtained to evaluate the vascular anatomy. CONTRAST:  100mL OMNIPAQUE IOHEXOL 350 MG/ML SOLN COMPARISON:  Radiographs 03/11/2015 FINDINGS: Cardiovascular: There is acute pulmonary embolism to the segmental arteries of both lower lobes. Overall the volume of pulmonary embolus is moderately low. There is no CT evidence of right heart strain. The thoracic aorta is normal in caliber and intact. Lungs: There is peripheral a based focal consolidation in the posterior basal segment of the left lower lobe. This could represent infarction, given the pulmonary embolus  in the feeding artery. It could also represent infectious infiltrate, aspiration or atelectasis. The lungs are otherwise clear. Central airways: Patent Effusions: Trace left pleural effusion. Lymphadenopathy: None Esophagus: Unremarkable Upper abdomen: No significant abnormality Musculoskeletal: No significant abnormality Review of the MIP images confirms the above findings. IMPRESSION: The study is positive for acute pulmonary embolism. The emboli involve segmental arteries of both lower lobes. No CT evidence of right heart strain. These results were called by telephone at the time of interpretation on 03/12/2015 at 2:02 am to Dr. Lynelle DoctorKnapp , who verbally acknowledged these results. Electronically Signed   By: Ellery Plunkaniel R Mitchell M.D.   On: 03/12/2015 02:03    Microbiology: No results found for this or any previous visit (from the past 240 hour(s)).   Labs: Basic Metabolic Panel:  Recent Labs Lab 03/11/15 2315  NA 137  K 3.6  CL 102  CO2 28  GLUCOSE 107*  BUN 17  CREATININE 1.06*  CALCIUM 8.9   Liver Function Tests:  Recent Labs Lab 03/11/15 2315  AST 17  ALT 17  ALKPHOS 61  BILITOT 0.7  PROT 7.6  ALBUMIN 3.7   No results for input(s): LIPASE, AMYLASE in the last 168 hours. No results for input(s): AMMONIA in the last 168 hours. CBC:  Recent Labs Lab 03/11/15 2315  WBC 7.3  NEUTROABS 4.8  HGB 12.3  HCT 38.1  MCV 87.4  PLT 228   Cardiac Enzymes:  Recent Labs Lab 03/11/15 2315  TROPONINI <0.03   BNP: BNP (last 3 results) No results for input(s): BNP in the last 8760 hours.  ProBNP (last 3 results) No results for input(s): PROBNP in the last 8760 hours.  CBG: No results for input(s): GLUCAP in the last 168 hours.     SignedChaya Jan:  HERNANDEZ Suarez,ESTELA  [facility] Pager: [phone] 03/12/2015, 10:52 AM

## 2015-03-12 NOTE — Discharge Instructions (Signed)
Rivaroxaban oral tablets What is this medicine? RIVAROXABAN (ri va ROX a ban) is an anticoagulant (blood thinner). It is used to treat blood clots in the lungs or in the veins. It is also used after knee or hip surgeries to prevent blood clots. It is also used to lower the chance of stroke in people with a medical condition called atrial fibrillation. This medicine may be used for other purposes; ask your health care provider or pharmacist if you have questions. What should I tell my health care provider before I take this medicine? They need to know if you have any of these conditions: -bleeding disorders -bleeding in the brain -blood in your stools (black or tarry stools) or if you have blood in your vomit -history of stomach bleeding -kidney disease -liver disease -low blood counts, like low white cell, platelet, or red cell counts -recent or planned spinal or epidural procedure -take medicines that treat or prevent blood clots -an unusual or allergic reaction to rivaroxaban, other medicines, foods, dyes, or preservatives -pregnant or trying to get pregnant -breast-feeding How should I use this medicine? Take this medicine by mouth with a glass of water. Follow the directions on the prescription label. Take your medicine at regular intervals. Do not take it more often than directed. Do not stop taking except on your doctor's advice. Stopping this medicine may increase your risk of a blood clot. Be sure to refill your prescription before you run out of medicine. If you are taking this medicine after hip or knee replacement surgery, take it with or without food. If you are taking this medicine for atrial fibrillation, take it with your evening meal. If you are taking this medicine to treat blood clots, take it with food at the same time each day. If you are unable to swallow your tablet, you may crush the tablet and mix it in applesauce. Then, immediately eat the applesauce. You should eat more  food right after you eat the applesauce containing the crushed tablet. Talk to your pediatrician regarding the use of this medicine in children. Special care may be needed. Overdosage: If you think you have taken too much of this medicine contact a poison control center or emergency room at once. NOTE: This medicine is only for you. Do not share this medicine with others. What if I miss a dose? If you take your medicine once a day and miss a dose, take the missed dose as soon as you remember. If you take your medicine twice a day and miss a dose, take the missed dose immediately. In this instance, 2 tablets may be taken at the same time. The next day you should take 1 tablet twice a day as directed. What may interact with this medicine? -aspirin and aspirin-like medicines -certain antibiotics like erythromycin, azithromycin, and clarithromycin -certain medicines for fungal infections like ketoconazole and itraconazole -certain medicines for irregular heart beat like amiodarone, quinidine, dronedarone -certain medicines for seizures like carbamazepine, phenytoin -certain medicines that treat or prevent blood clots like warfarin, enoxaparin, and dalteparin -conivaptan -diltiazem -felodipine -indinavir -lopinavir; ritonavir -NSAIDS, medicines for pain and inflammation, like ibuprofen or naproxen -ranolazine -rifampin -ritonavir -St. John's wort -verapamil This list may not describe all possible interactions. Give your health care provider a list of all the medicines, herbs, non-prescription drugs, or dietary supplements you use. Also tell them if you smoke, drink alcohol, or use illegal drugs. Some items may interact with your medicine. What should I watch for while using this   medicine? Visit your doctor or health care professional for regular checks on your progress. Your condition will be monitored carefully while you are receiving this medicine. Notify your doctor or health care  professional and seek emergency treatment if you develop breathing problems; changes in vision; chest pain; severe, sudden headache; pain, swelling, warmth in the leg; trouble speaking; sudden numbness or weakness of the face, arm, or leg. These can be signs that your condition has gotten worse. If you are going to have surgery, tell your doctor or health care professional that you are taking this medicine. Tell your health care professional that you use this medicine before you have a spinal or epidural procedure. Sometimes people who take this medicine have bleeding problems around the spine when they have a spinal or epidural procedure. This bleeding is very rare. If you have a spinal or epidural procedure while on this medicine, call your health care professional immediately if you have back pain, numbness or tingling (especially in your legs and feet), muscle weakness, paralysis, or loss of bladder or bowel control. Avoid sports and activities that might cause injury while you are using this medicine. Severe falls or injuries can cause unseen bleeding. Be careful when using sharp tools or knives. Consider using an electric razor. Take special care brushing or flossing your teeth. Report any injuries, bruising, or red spots on the skin to your doctor or health care professional. What side effects may I notice from receiving this medicine? Side effects that you should report to your doctor or health care professional as soon as possible: -allergic reactions like skin rash, itching or hives, swelling of the face, lips, or tongue -back pain -redness, blistering, peeling or loosening of the skin, including inside the mouth -signs and symptoms of bleeding such as bloody or black, tarry stools; red or dark-brown urine; spitting up blood or brown material that looks like coffee grounds; red spots on the skin; unusual bruising or bleeding from the eye, gums, or nose Side effects that usually do not require  medical attention (Report these to your doctor or health care professional if they continue or are bothersome.): -dizziness -muscle pain This list may not describe all possible side effects. Call your doctor for medical advice about side effects. You may report side effects to FDA at 1-800-FDA-1088. Where should I keep my medicine? Keep out of the reach of children. Store at room temperature between 15 and 30 degrees C (59 and 86 degrees F). Throw away any unused medicine after the expiration date. NOTE: This sheet is a summary. It may not cover all possible information. If you have questions about this medicine, talk to your doctor, pharmacist, or health care provider.    2016, Elsevier/Gold Standard. (2014-03-25 12:45:34)  

## 2015-03-12 NOTE — ED Provider Notes (Signed)
CSN: [id number]     Arrival date & time 03/11/15  2224 History   First MD Initiated Contact with Patient 03/12/15 0001     Chief Complaint  Patient presents with  . Shortness of Breath     (Consider location/radiation/quality/duration/timing/severity/associated sxs/prior Treatment) HPI   Megan Suarez is a 29 y.o. female who presents to the Emergency Department complaining of pain to her left chest wall, shoulder and upper back.  Pain onset was 2 days ago.  Today, she states the pain has increased and worse to her upper back area and associated with shortness of breath.  She states the pain seems worse with sitting and leaning forward.  She states nothing makes the pain better.  She also states she has been having chills.  She denies recent injury, fever, cold or cough, dysuria, N/V, recent travel or previous PE/DVT.  She does admit to a recent, more sedentary, job.     Past Medical History  Diagnosis Date  . Vaginal Pap smear, abnormal   . Pelvic pain in female 10/15/2014  . Dyspareunia 10/15/2014  . Menorrhagia with regular cycle 10/15/2014  . Dysmenorrhea 10/15/2014   History reviewed. No pertinent past surgical history. No family history on file. Social History  Substance Use Topics  . Smoking status: Former Smoker    Types: Cigarettes  . Smokeless tobacco: Never Used  . Alcohol Use: Yes     Comment: once-twice weekly   OB History    Gravida Para Term Preterm AB TAB SAB Ectopic Multiple Living       Review of Systems  Constitutional: Positive for fever. Negative for activity change and appetite change.  HENT: Negative for congestion, rhinorrhea, sore throat and trouble swallowing.   Respiratory: Positive for shortness of breath. Negative for cough, chest tightness, wheezing and stridor.   Cardiovascular: Positive for chest pain.  Gastrointestinal: Positive for abdominal pain. Negative for nausea, vomiting and diarrhea.  Genitourinary: Negative for  dysuria, urgency, frequency and flank pain.  Musculoskeletal: Positive for back pain (left upper back and shoulder pain).  Skin: Negative for rash.  Neurological: Negative for dizziness, syncope, weakness, light-headedness and numbness.      Allergies  Review of patient's allergies indicates no known allergies.  Home Medications   Prior to Admission medications   Medication Sig Start Date End Date Taking? Authorizing Provider  ibuprofen (ADVIL,MOTRIN) 800 MG tablet Take 1 tablet (800 mg total) by mouth every 8 (eight) hours as needed. 10/16/14   Adline Potter, NP  lamoTRIgine (LAMICTAL) 25 MG tablet Take 25 mg by mouth daily.    Historical Provider, MD  Norethin Ace-Eth Estrad-FE (MINASTRIN 24 FE) 1-20 MG-MCG(24) CHEW Chew 1 tablet by mouth daily. 10/15/14   Adline Potter, NP   BP 132/91 mmHg  Pulse 86  Temp(Src) 98.2 F (36.8 C) (Oral)  Resp 14  Ht  (1.676 m)  Wt 93.895 kg  BMI 33.43 kg/m2  SpO2 100%  LMP 03/04/2015 Physical Exam  Constitutional: She is oriented to person, place, and time. She appears well-developed and well-nourished.  Uncomfortable appearing  HENT:  Head: Normocephalic and atraumatic.  Mouth/Throat: Oropharynx is clear and moist.  Neck: Normal range of motion. Neck supple.  Cardiovascular: Regular rhythm and intact distal pulses.  Tachycardia present.   Pulmonary/Chest: Effort normal and breath sounds normal. No respiratory distress. She has no wheezes. She has no rales. She exhibits tenderness.  left chest wall  tender to palpation.  No crepitus or guarding  Abdominal: Soft.  Mild left upper abdominal tenderness w/o guarding or rebound tenderness.    Musculoskeletal: Normal range of motion. She exhibits no edema.  Lymphadenopathy:    She has no cervical adenopathy.  Neurological: She is alert and oriented to person, place, and time. She exhibits normal muscle tone. Coordination normal.  Skin: Skin is warm and dry. No rash noted.   Psychiatric: She has a normal mood and affect.  Nursing note and vitals reviewed.   ED Course  Procedures (including critical care time) Labs Review Labs Reviewed  COMPREHENSIVE METABOLIC PANEL - Abnormal; Notable for the following:    Glucose, Bld 107 (*)    Creatinine, Ser 1.06 (*)    All other components within normal limits  D-DIMER, QUANTITATIVE (NOT AT [facility]) - Abnormal; Notable for the following:    D-Dimer, Quant 1.12 (*)    All other components within normal limits  CBC WITH DIFFERENTIAL/PLATELET  TROPONIN I  URINALYSIS, ROUTINE W REFLEX MICROSCOPIC (NOT AT [facility])  POC URINE PREG, ED    Imaging Review Dg Chest 2 View  03/11/2015  CLINICAL DATA:  Dyspnea and left chest pain radiating to the back. EXAM: CHEST  2 VIEW COMPARISON:  None. FINDINGS: The heart size and mediastinal contours are within normal limits. Both lungs are clear. The visualized skeletal structures are unremarkable. IMPRESSION: No active cardiopulmonary disease. Electronically Signed   By: Ellery Plunkaniel R Mitchell M.D.   On: 03/11/2015 23:55   I have personally reviewed and evaluated these images and lab results as part of my medical decision-making.   EKG Interpretation   Date/Time:  Thursday March 11 2015 22:38:14 EST Ventricular Rate:  86 PR Interval:  167 QRS Duration: 85 QT Interval:  356 QTC Calculation: 426 R Axis:   78 Text Interpretation:  Sinus rhythm Confirmed by ZACKOWSKI  MD, SCOTT  ([id number]) on 03/11/2015 10:43:19 PM      MDM   Final diagnoses:  None    Pt with increasing left chest wall pain and dyspnea.  Pain worse tonight.  Mildly tachycardic.  Will check labs and CXR.  0110  Patient is feeling better after IV pain medications, appears more comfortable.  Discussed lab results, d-dimer elevated, will order CT angio  0130  End of shift, pt discussed with Dr. Devoria AlbeIva Knapp, who will review CT angio results and arrange dispo    Pauline Ausammy Tiernan Millikin, PA-C 03/12/15 0146  Devoria AlbeIva Knapp,  MD 03/12/15 [id number]  Devoria AlbeIva Knapp, MD 03/12/15 [phone]

## 2015-03-12 NOTE — Care Management Note (Signed)
Case Management Note  Patient Details  Name: Megan Suarez MRN: [id number] Date of Birth: [birth year]  Subjective/Objective:                  Pt is from home and ind with ADL's. Pt admitted with PE. Pt is employed but uninsured. Pt has been to Dr. Felecia ShellingFanta in the past. Dr. Letitia NeriFanta's office now will not see her due to her not having insurance.  Action/Plan: [facility]. Pt [facility], per her choice of providers. Pt given application and brochure, Pt given voucher for xeralto and instructed to contact the financial assistance number for assistance with cost of medication. No further CM needs.   Expected Discharge Date:    03/12/2015              Expected Discharge Plan:  Home/Self Care  In-House Referral:  Financial Counselor  Discharge planning Services  CM Consult, Follow-up appt scheduled, [facility]  Post Acute Care Choice:  NA Choice offered to:  NA  DME Arranged:    DME Agency:     HH Arranged:    HH Agency:     Status of Service:  Completed, signed off  Medicare Important Message Given:    Date Medicare IM Given:    Medicare IM give by:    Date Additional Medicare IM Given:    Additional Medicare Important Message give by:     If discussed at Long Length of Stay Meetings, dates discussed:    Additional Comments:  Malcolm MetroChildress, Ronae Noell Demske, RN 03/12/2015, 1:46 PM

## 2015-03-14 LAB — HOMOCYSTEINE: Homocysteine: 7 umol/L (ref 0.0–15.0)

## 2015-03-15 LAB — PROTEIN C, TOTAL: Protein C, Total: 88 % (ref 60–150)

## 2015-03-15 LAB — PROTEIN S ACTIVITY: Protein S Activity: 118 % (ref 63–140)

## 2015-03-15 LAB — PROTEIN C ACTIVITY: Protein C Activity: 82 % (ref 73–180)

## 2015-03-15 LAB — PROTEIN S, TOTAL: Protein S Ag, Total: 141 % (ref 60–150)

## 2015-03-16 LAB — LUPUS ANTICOAGULANT PANEL
DRVVT: 56.8 s — AB (ref 0.0–44.0)
PTT LA: 49.6 s — AB (ref 0.0–40.6)

## 2015-03-16 LAB — DRVVT MIX: dRVVT Mix: 45.7 s — ABNORMAL HIGH (ref 0.0–44.0)

## 2015-03-16 LAB — HEXAGONAL PHASE PHOSPHOLIPID: HEXAGONAL PHASE PHOSPHOLIPID: 8 s (ref 0–11)

## 2015-03-16 LAB — DRVVT CONFIRM: dRVVT Confirm: 1.4 ratio — ABNORMAL HIGH (ref 0.8–1.2)

## 2015-03-16 LAB — PTT-LA MIX: PTT-LA Mix: 46.7 s — ABNORMAL HIGH (ref 0.0–40.6)

## 2015-03-17 LAB — FACTOR 5 LEIDEN

## 2015-03-18 LAB — PROTHROMBIN GENE MUTATION

## 2015-03-19 LAB — CARDIOLIPIN ANTIBODIES, IGG, IGM, IGA
Anticardiolipin IgA: <9 APL U/mL (ref 0–11)
Anticardiolipin IgG: <9 GPL U/mL (ref 0–14)
Anticardiolipin IgM: <9 MPL U/mL (ref 0–12)

## 2015-03-19 LAB — BETA-2-GLYCOPROTEIN I ABS, IGG/M/A
Beta-2-Glycoprotein I IgA: <9 GPI IgA units (ref 0–25)
Beta-2-Glycoprotein I IgM: <9 GPI IgM units (ref 0–32)

## 2015-06-15 ENCOUNTER — Emergency Department (HOSPITAL_COMMUNITY)
Admission: EM | Admit: 2015-06-15 | Discharge: 2015-06-15 | Disposition: A | Discharge status: Home / Self Care | Payer: 59 | Attending: Emergency Medicine | Admitting: Emergency Medicine

## 2015-06-15 ENCOUNTER — Encounter (HOSPITAL_COMMUNITY): Payer: Self-pay | Admitting: Emergency Medicine

## 2015-06-15 DIAGNOSIS — Z87891 Personal history of nicotine dependence: Secondary | ICD-10-CM | POA: Insufficient documentation

## 2015-06-15 DIAGNOSIS — O200 Threatened abortion: Secondary | ICD-10-CM | POA: Diagnosis not present

## 2015-06-15 DIAGNOSIS — Z79899 Other long term (current) drug therapy: Secondary | ICD-10-CM | POA: Insufficient documentation

## 2015-06-15 DIAGNOSIS — Z3A01 Less than 8 weeks gestation of pregnancy: Secondary | ICD-10-CM | POA: Insufficient documentation

## 2015-06-15 HISTORY — DX: Other pulmonary embolism without acute cor pulmonale: I26.99

## 2015-06-15 LAB — ABO/RH: ABO/RH(D): A POS

## 2015-06-15 LAB — HCG, QUANTITATIVE, PREGNANCY: hCG, Beta Chain, Quant, S: 128 m[IU]/mL — ABNORMAL HIGH (ref <5)

## 2015-06-15 MED ORDER — OXYCODONE-ACETAMINOPHEN 5-325 MG PO TABS
1.0000 | ORAL_TABLET | Freq: Once | ORAL | Status: AC
  Administered 2015-06-15: 1 via ORAL
  Filled 2015-06-15: qty 1

## 2015-06-15 NOTE — ED Notes (Signed)
Pt states that she thinks she is about [redacted] weeks pregnant and began having bleeding Saturday that has increased in heaviness.  Also abdominal cramping.

## 2015-06-15 NOTE — ED Provider Notes (Signed)
CSN: [id number]     Arrival date & time 06/15/15  4098 History  By signing my name below, I, Megan Suarez, attest that this documentation has been prepared under the direction and in the presence of Megan Razor, MD . Electronically Signed: Freida Suarez, Scribe. 06/15/2015. 10:59 AM.    Chief Complaint  Patient presents with  . Vaginal Bleeding    The history is provided by the patient. No language interpreter was used.    HPI Comments:  Megan Suarez is a 30 y.o. female who presents to the Emergency Department complaining of abnormal vaginal bleeding x 4 days. Pt states she had multiple positive home pregnancy tests but has not yet followed up with an OBGYN. Her LNMP was 05/05/15. Pt reports associated back and abdominal pain.  She denies CP, SOB. and LE pain/swelling. No alleviating factors noted. Pt is G1P0A0.   Past Medical History  Diagnosis Date  . Vaginal Pap smear, abnormal   . Pelvic pain in female 10/15/2014  . Dyspareunia 10/15/2014  . Menorrhagia with regular cycle 10/15/2014  . Dysmenorrhea 10/15/2014  . Pulmonary embolus (HCC)    History reviewed. No pertinent past surgical history. History reviewed. No pertinent family history. Social History  Substance Use Topics  . Smoking status: Former Smoker    Types: Cigarettes  . Smokeless tobacco: Never Used  . Alcohol Use: Yes     Comment: once-twice weekly   OB History    Gravida Para Term Preterm AB TAB SAB Ectopic Multiple Living       Review of Systems  Constitutional: Negative for fever and chills.  Respiratory: Negative for shortness of breath.   Cardiovascular: Negative for leg swelling.  Gastrointestinal: Positive for abdominal pain.  Genitourinary: Positive for vaginal bleeding.  Musculoskeletal: Positive for back pain.  All other systems reviewed and are negative.   Allergies  Review of patient's allergies indicates no known allergies.  Home Medications   Prior to Admission  medications   Medication Sig Start Date End Date Taking? Authorizing Provider  ibuprofen (ADVIL,MOTRIN) 800 MG tablet Take 1 tablet (800 mg total) by mouth every 8 (eight) hours as needed. 10/16/14  Yes Adline Potter, NP  lamoTRIgine (LAMICTAL) 25 MG tablet Take 25 mg by mouth daily. Reported on 06/15/2015    Historical Provider, MD  Rivaroxaban (XARELTO STARTER PACK) 15 & 20 MG TBPK Take as directed on package: Start with one  tablet by mouth twice a day with food. On Day 22, switch to one  tablet once a day with food. Patient not taking: Reported on 06/15/2015 03/12/15   Henderson Cloud, MD  rivaroxaban (XARELTO) 20 MG TABS tablet Take 1 tablet (20 mg total) by mouth daily with supper. Patient not taking: Reported on 06/15/2015 03/12/15   Henderson Cloud, MD   BP 156/77 mmHg  Pulse 76  Temp(Src) 98.1 F (36.7 C) (Oral)  Resp 18  Ht  (1.676 m)  Wt 215 lb (97.523 kg)  BMI 34.72 kg/m2  SpO2 100%  LMP 05/04/2015  Breastfeeding? Unknown Physical Exam  Constitutional: She appears well-developed and well-nourished. No distress.  HENT:  Head: Normocephalic and atraumatic.  Eyes: Conjunctivae are normal. Right eye exhibits no discharge. Left eye exhibits no discharge.  Neck: Neck supple.  Cardiovascular: Normal rate, regular rhythm and normal heart sounds.  Exam reveals no gallop and no friction rub.   No murmur heard. Pulmonary/Chest: Effort normal  and breath sounds normal. No respiratory distress.  Abdominal: Soft. She exhibits no distension. There is tenderness (mild) in the suprapubic area.  Musculoskeletal: She exhibits no edema or tenderness.  Neurological: She is alert.  Skin: Skin is warm and dry.  Psychiatric: She has a normal mood and affect. Her behavior is normal. Thought content normal.  Nursing note and vitals reviewed.   ED Course  Procedures   DIAGNOSTIC STUDIES:  Oxygen Saturation is 100% on RA, normal by my interpretation.     COORDINATION OF CARE:  10:45 AM Discussed treatment plan with pt at bedside and pt agreed to plan.   Labs Review Labs Reviewed  HCG, QUANTITATIVE, PREGNANCY - Abnormal; Notable for the following:    hCG, Beta Chain, Quant, S 128 (*)    All other components within normal limits  ABO/RH    Imaging Review No results found. I have personally reviewed and evaluated these images and lab results as part of my medical decision-making.   EKG Interpretation None      MDM   Final diagnoses:  Threatened miscarriage    30 year old female with vaginal bleeding in early pregnancy.  Her quantitative hCG is only 128. Can be followed for trending purposes but with her estimated gestational age and the amount of bleeding she is having, this is likely inevitable miscarriage. Reassurance provided. Return precautions discussed. Outpt FU. Patient was previously answer out but has not taken at least several weeks. She denies any signs or symptoms of DVT or PE.  Megan RazorStephen Eunice Oldaker, MD 06/18/15 [phone]

## 2015-06-15 NOTE — Discharge Instructions (Signed)

## 2016-03-21 ENCOUNTER — Emergency Department (HOSPITAL_BASED_OUTPATIENT_CLINIC_OR_DEPARTMENT_OTHER)
Admission: EM | Admit: 2016-03-21 | Discharge: 2016-03-21 | Disposition: A | Discharge status: Home / Self Care | Payer: 59 | Attending: Emergency Medicine | Admitting: Emergency Medicine

## 2016-03-21 ENCOUNTER — Encounter (HOSPITAL_BASED_OUTPATIENT_CLINIC_OR_DEPARTMENT_OTHER): Payer: Self-pay | Admitting: Emergency Medicine

## 2016-03-21 DIAGNOSIS — Z791 Long term (current) use of non-steroidal anti-inflammatories (NSAID): Secondary | ICD-10-CM | POA: Insufficient documentation

## 2016-03-21 DIAGNOSIS — Z87891 Personal history of nicotine dependence: Secondary | ICD-10-CM | POA: Insufficient documentation

## 2016-03-21 DIAGNOSIS — N760 Acute vaginitis: Secondary | ICD-10-CM | POA: Diagnosis not present

## 2016-03-21 LAB — URINALYSIS, ROUTINE W REFLEX MICROSCOPIC
BILIRUBIN URINE: NEGATIVE
Glucose, UA: NEGATIVE mg/dL
HGB URINE DIPSTICK: NEGATIVE
KETONES UR: NEGATIVE mg/dL
NITRITE: NEGATIVE
PROTEIN: NEGATIVE mg/dL
Specific Gravity, Urine: 1.019 (ref 1.005–1.030)
pH: 5.5 (ref 5.0–8.0)

## 2016-03-21 LAB — WET PREP, GENITAL
Sperm: NONE SEEN
TRICH WET PREP: NONE SEEN

## 2016-03-21 LAB — URINALYSIS, MICROSCOPIC (REFLEX)

## 2016-03-21 LAB — PREGNANCY, URINE: PREG TEST UR: NEGATIVE

## 2016-03-21 MED ORDER — FLUCONAZOLE 150 MG PO TABS: 150.0000 mg | ORAL_TABLET | Freq: Once | ORAL | 0 refills | Status: AC

## 2016-03-21 MED ORDER — METRONIDAZOLE 500 MG PO TABS: 500.0000 mg | ORAL_TABLET | Freq: Two times a day (BID) | ORAL | 0 refills | Status: DC

## 2016-03-21 MED FILL — metroNIDAZOLE 500 MG TABS: 500 | 7 days supply | Qty: 14 | Fill #0

## 2016-03-21 MED FILL — FLUCONAZOLE 150 MG TABLET: 150 | 1 days supply | Qty: 1 | Fill #0

## 2016-03-21 NOTE — ED Triage Notes (Signed)
Patient reports vaginal irritation and white discharge that started this morning. Patient has not attempted any OTC treatments at this time.

## 2016-03-21 NOTE — Discharge Instructions (Signed)
Call the women's outpatient clinic to be regular gynecologist and to be seen if not better in a week. Take the medication as prescribed. Don't drink any alcohol, beer or wine with the medication prescribed as it will make you very ill. You've been tested for sexually transmitted diseases and HIV. This test results will not come back today You will be called if those tests are abnormal.

## 2016-03-21 NOTE — ED Provider Notes (Signed)
[facility] Provider Note   CSN: [id number] Arrival date & time: 03/21/16  1345     History   Chief Complaint Chief Complaint  Patient presents with  . Vaginal Discharge    & irritation    HPI Megan Suarez is a 30 y.o. female.Complains of vaginal itching and burning onset this morning. Other associated symptoms include urinary frequency. Denies abdominal pain denies fever denies vomiting. Last normal menstrual period was 2 weeks ago. No treatment prior to coming here. Nothing makes symptoms better or worse. No other associated symptoms. She denies any abnormal vaginal discharge  HPI  Past Medical History:  Diagnosis Date  . Dysmenorrhea 10/15/2014  . Dyspareunia 10/15/2014  . Menorrhagia with regular cycle 10/15/2014  . Pelvic pain in female 10/15/2014  . Pulmonary embolus (HCC)   . Vaginal Pap smear, abnormal     Patient Active Problem List   Diagnosis Date Noted  . Chest pain 03/12/2015  . 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 SOB (shortness of breath) 03/12/2015  . Pelvic pain in female 10/15/2014  . Dyspareunia 10/15/2014  . Menorrhagia with regular cycle 10/15/2014  . Dysmenorrhea 10/15/2014    History reviewed. No pertinent surgical history.  OB History    Gravida Para Term Preterm AB Living   1 0 0 0 0 0   SAB TAB Ectopic Multiple Live Births   0 0 0 0         Home Medications    Prior to Admission medications   Medication Sig Start Date End Date Taking? Authorizing Provider  ibuprofen (ADVIL,MOTRIN) 800 MG tablet Take 1 tablet (800 mg total) by mouth every 8 (eight) hours as needed. 10/16/14   Adline Potter, NP  lamoTRIgine (LAMICTAL) 25 MG tablet Take 25 mg by mouth daily. Reported on 06/15/2015    Historical Provider, MD  Rivaroxaban (XARELTO STARTER PACK) 15 & 20 MG TBPK Take as directed on package: Start with one 15mg  tablet by mouth twice a day with food. On Day 22, switch to one 20mg  tablet once a day with  food. Patient not taking: Reported on 06/15/2015 03/12/15   Henderson Cloud, MD  rivaroxaban (XARELTO) 20 MG TABS tablet Take 1 tablet (20 mg total) by mouth daily with supper. Patient not taking: Reported on 06/15/2015 03/12/15   Henderson Cloud, MD   No longer on Xarelto Family History History reviewed. No pertinent family history.  Social History Social History  Substance Use Topics  . Smoking status: Former Smoker    Types: Cigarettes  . Smokeless tobacco: Never Used  . Alcohol use Yes     Comment: once-twice weekly   Positive smoker occasional alcohol no illicit drug use  Allergies   Patient has no known allergies.   Review of Systems Review of Systems  Constitutional: Negative.   HENT: Negative.   Respiratory: Negative.   Cardiovascular: Negative.   Gastrointestinal: Negative.   Genitourinary: Positive for frequency.       Vaginal burning and itching  Musculoskeletal: Negative.   Skin: Negative.   Neurological: Negative.   Psychiatric/Behavioral: Negative.   All other systems reviewed and are negative.    Physical Exam Updated Vital Signs BP 119/95 (BP Location: Left Arm)   Pulse 85   Temp 98.2 F (36.8 C) (Oral)   Resp 18   Ht 5\' 6"  (1.676 m)   Wt 183 lb (83 kg)   LMP 03/07/2016 (Approximate)   SpO2 99%  Breastfeeding? No   BMI 29.54 kg/m   Physical Exam  Constitutional: She appears well-developed and well-nourished.  HENT:  Head: Normocephalic and atraumatic.  Eyes: Conjunctivae are normal. Pupils are equal, round, and reactive to light.  Neck: Neck supple. No tracheal deviation present. No thyromegaly present.  Cardiovascular: Normal rate and regular rhythm.   No murmur heard. Pulmonary/Chest: Effort normal and breath sounds normal.  Abdominal: Soft. Bowel sounds are normal. She exhibits no distension. There is no tenderness.  Genitourinary:  Genitourinary Comments: No external lesion. White vaginal discharge. Cervical os  closed. No cervical motion tenderness or adnexal tenderness or masses  Musculoskeletal: Normal range of motion. She exhibits no edema or tenderness.  Neurological: She is alert. Coordination normal.  Skin: Skin is warm and dry. No rash noted.  Psychiatric: She has a normal mood and affect.  Nursing note and vitals reviewed.    ED Treatments / Results  Labs (all labs ordered are listed, but only abnormal results are displayed) Labs Reviewed  URINALYSIS, ROUTINE W REFLEX MICROSCOPIC - Abnormal; Notable for the following:       Result Value   APPearance CLOUDY (*)    Leukocytes, UA LARGE (*)    All other components within normal limits  URINALYSIS, MICROSCOPIC (REFLEX) - Abnormal; Notable for the following:    Bacteria, UA RARE (*)    Squamous Epithelial / LPF 6-30 (*)    All other components within normal limits  WET PREP, GENITAL  PREGNANCY, URINE  RPR  HIV ANTIBODY (ROUTINE TESTING)  GC/CHLAMYDIA PROBE AMP ([place]) NOT AT [facility]    EKG  EKG Interpretation None       Radiology No results found.  Procedures Procedures (including critical care time)  Medications Ordered in ED Medications - No data to display  Results for orders placed or performed during the hospital encounter of 03/21/16  Wet prep, genital  Result Value Ref Range   Yeast Wet Prep HPF POC PRESENT (A) NONE SEEN   Trich, Wet Prep NONE SEEN NONE SEEN   Clue Cells Wet Prep HPF POC PRESENT (A) NONE SEEN   WBC, Wet Prep HPF POC MANY (A) NONE SEEN   Sperm NONE SEEN   Urinalysis, Routine w reflex microscopic  Result Value Ref Range   Color, Urine YELLOW YELLOW   APPearance CLOUDY (A) CLEAR   Specific Gravity, Urine 1.019 1.005 - 1.030   pH 5.5 5.0 - 8.0   Glucose, UA NEGATIVE NEGATIVE mg/dL   Hgb urine dipstick NEGATIVE NEGATIVE   Bilirubin Urine NEGATIVE NEGATIVE   Ketones, ur NEGATIVE NEGATIVE mg/dL   Protein, ur NEGATIVE NEGATIVE mg/dL   Nitrite NEGATIVE NEGATIVE   Leukocytes, UA LARGE (A)  NEGATIVE  Pregnancy, urine  Result Value Ref Range   Preg Test, Ur NEGATIVE NEGATIVE  Urinalysis, Microscopic (reflex)  Result Value Ref Range   RBC / HPF 0-5 0 - 5 RBC/hpf   WBC, UA 6-30 0 - 5 WBC/hpf   Bacteria, UA RARE (A) NONE SEEN   Squamous Epithelial / LPF 6-30 (A) NONE SEEN   Mucous PRESENT    No results found. Initial Impression / Assessment and Plan / ED Course  I have reviewed the triage vital signs and the nursing notes.  Pertinent labs & imaging results that were available during my care of the patient were reviewed by me and considered in my medical decision making (see chart for details).  Clinical Course     Resting comfortably. Plan prescription Diflucan, Flagyl.  STD panel, urine culture pending. Referral women's outpatient clinic  Final Clinical Impressions(s) / ED Diagnoses  Diagnoses vaginitis Final diagnoses:  None    New Prescriptions New Prescriptions   No medications on file     Doug SouSam Myquan Schaumburg, MD 03/21/16 1621

## 2016-03-22 LAB — RPR: RPR Ser Ql: NONREACTIVE

## 2016-03-22 LAB — GC/CHLAMYDIA PROBE AMP (~~LOC~~) NOT AT ARMC
CHLAMYDIA, DNA PROBE: NEGATIVE
Neisseria Gonorrhea: NEGATIVE

## 2016-03-22 LAB — HIV ANTIBODY (ROUTINE TESTING W REFLEX): HIV Screen 4th Generation wRfx: NONREACTIVE

## 2016-03-23 LAB — URINE CULTURE

## 2016-03-24 ENCOUNTER — Telehealth (HOSPITAL_BASED_OUTPATIENT_CLINIC_OR_DEPARTMENT_OTHER): Payer: Self-pay

## 2016-03-24 NOTE — Progress Notes (Signed)
ED Antimicrobial Stewardship Positive Culture Follow Up   [name] is an 30 y.o. female [facility] with a chief complaint of  Chief Complaint  Patient presents with  . Vaginal Discharge    & irritation    Recent Results (from the past 720 hour(s))  Urine culture     Status: Abnormal   Collection Time: 03/21/16  1:56 PM  Result Value Ref Range Status   Specimen Description URINE, CLEAN CATCH  Final   Special Requests NONE  Final   Culture (A)  Final    >=100,000 COLONIES/mL STREPTOCOCCUS AGALACTIAE TESTING AGAINST S. AGALACTIAE NOT ROUTINELY PERFORMED DUE TO PREDICTABILITY OF AMP/PEN/VAN SUSCEPTIBILITY. [facility]    Report Status 03/23/2016 FINAL  Final  Wet prep, genital     Status: Abnormal   Collection Time: 03/21/16  3:39 PM  Result Value Ref Range Status   Yeast Wet Prep HPF POC PRESENT (A) NONE SEEN Final   Trich, Wet Prep NONE SEEN NONE SEEN Final   Clue Cells Wet Prep HPF POC PRESENT (A) NONE SEEN Final   WBC, Wet Prep HPF POC MANY (A) NONE SEEN Final   Sperm NONE SEEN  Final    Most likely contaminant or colonized. Will consider asymptomatic bacteruria. No treatment necessary.  ED Provider: Harolyn RutherfordShawn Joy PA-C   [name],[name] 03/24/2016, 9:43 AM Infectious Diseases Pharmacist Phone# [phone]

## 2016-03-24 NOTE — Telephone Encounter (Signed)
Post ED Visit - Positive Culture Follow-up  Culture report reviewed by antimicrobial stewardship pharmacist:  [x]  Megan Suarez, Pharm.D. []  Celedonio MiyamotoJeremy Frens, Pharm.D., BCPS []  Garvin FilaMike Maccia, Pharm.D. []  Georgina PillionElizabeth Martin, Pharm.D., BCPS []  M[place] LakeMinh Pham, [address]Ph[address]D., BCPS, AAHIVP []  Estella HuskMichelle Turner, Pharm.D., BCPS, AAHIVP []  Tennis Mustassie Stewart, Pharm.D. []  Rob Oswaldo DoneVincent, [address]Ph[address]  Positive urine culture, >/= 100,000 colonies -> Streptococcus Agalactiae Treated with metronidazole & fluconazole  Chart reviewed by Harolyn RutherfordShawn Joy PA-C "No treatment necessary"  Arvid RightClark, Kikuye Korenek Dorn 03/24/2016, 10:38 AM

## 2016-05-19 DIAGNOSIS — Z029 Encounter for administrative examinations, unspecified: Secondary | ICD-10-CM

## 2017-01-25 IMAGING — DX DG ANKLE 2V *L*
2 series · 2 of 2 positions shown · non-contrast
Comparison: None.

CLINICAL DATA: Pain medial side of LEFT ankle for 2 days.

EXAM:
LEFT ANKLE - 2 VIEW

[ankle ap]
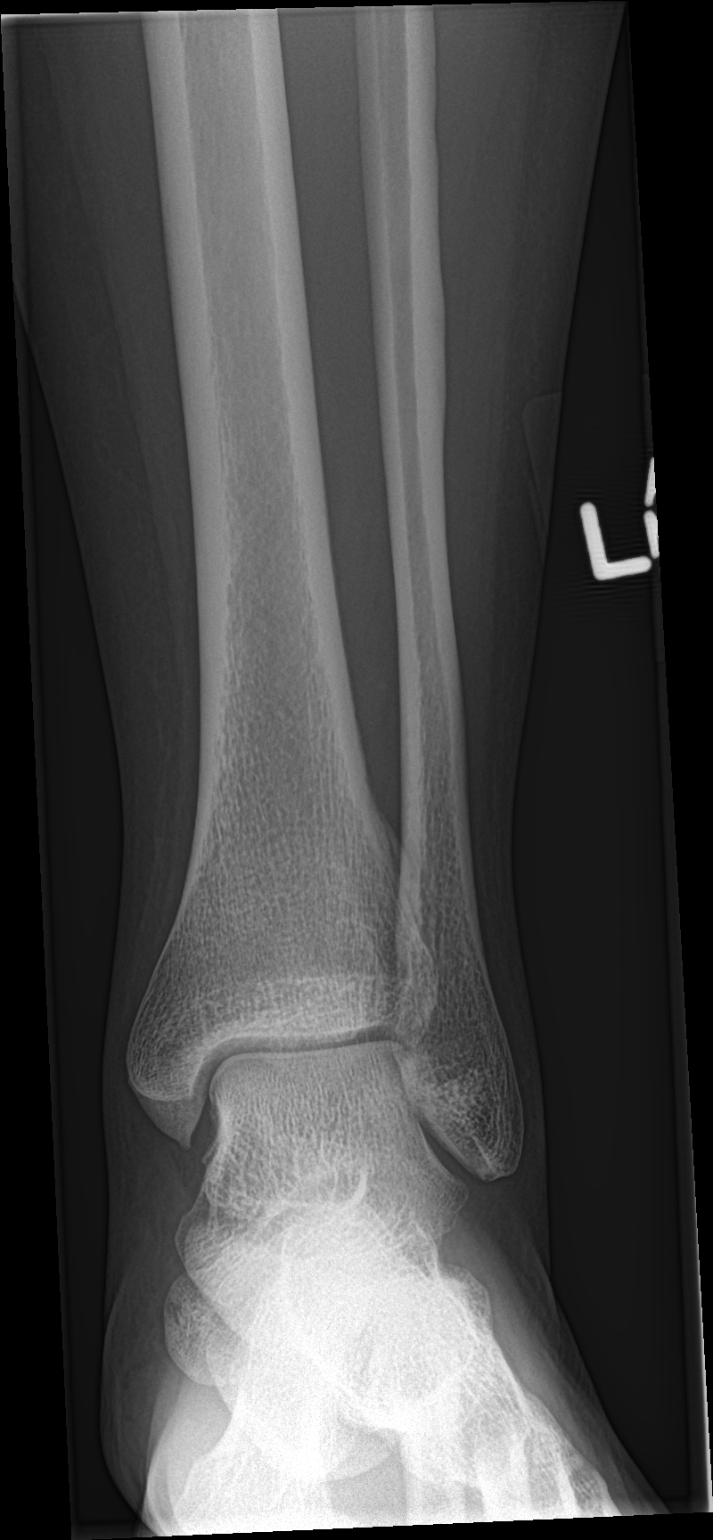

[ankle lat]
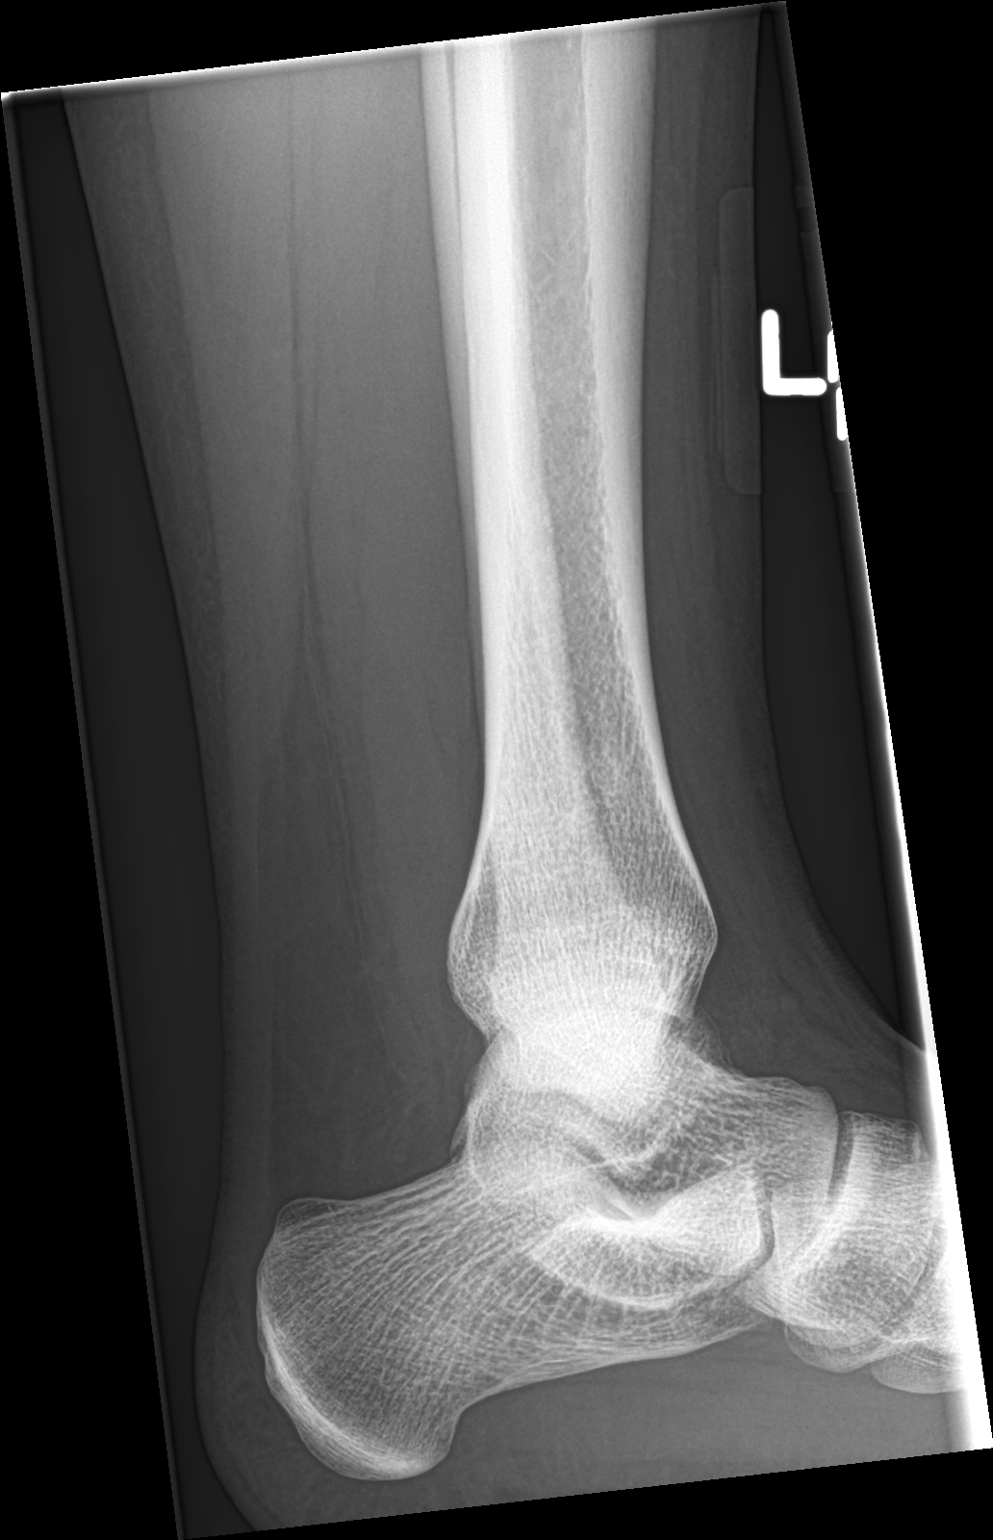

[2 of 2 positions shown; findings below may reference images not displayed]

FINDINGS: Ankle mortise intact. The talar dome is normal. No malleolar
fracture. The calcaneus is normal.
IMPRESSION: No fracture or dislocation.

## 2017-02-14 ENCOUNTER — Encounter: Payer: Self-pay | Admitting: Emergency Medicine

## 2017-02-14 ENCOUNTER — Other Ambulatory Visit: Payer: Self-pay

## 2017-02-14 ENCOUNTER — Emergency Department
Admission: EM | Admit: 2017-02-14 | Discharge: 2017-02-14 | Disposition: A | Discharge status: Home / Self Care | Payer: 59 | Attending: Emergency Medicine | Admitting: Emergency Medicine

## 2017-02-14 ENCOUNTER — Emergency Department: Payer: 59

## 2017-02-14 DIAGNOSIS — Z79899 Other long term (current) drug therapy: Secondary | ICD-10-CM | POA: Insufficient documentation

## 2017-02-14 DIAGNOSIS — R350 Frequency of micturition: Secondary | ICD-10-CM | POA: Insufficient documentation

## 2017-02-14 DIAGNOSIS — R0602 Shortness of breath: Secondary | ICD-10-CM | POA: Insufficient documentation

## 2017-02-14 DIAGNOSIS — R05 Cough: Secondary | ICD-10-CM | POA: Insufficient documentation

## 2017-02-14 DIAGNOSIS — N10 Acute tubulo-interstitial nephritis: Secondary | ICD-10-CM | POA: Diagnosis not present

## 2017-02-14 DIAGNOSIS — Z87891 Personal history of nicotine dependence: Secondary | ICD-10-CM | POA: Insufficient documentation

## 2017-02-14 LAB — CBC
HEMATOCRIT: 39.3 % (ref 35.0–47.0)
HEMOGLOBIN: 13 g/dL (ref 12.0–16.0)
MCH: 28 pg (ref 26.0–34.0)
MCHC: 33 g/dL (ref 32.0–36.0)
MCV: 85.1 fL (ref 80.0–100.0)
Platelets: 208 10*3/uL (ref 150–440)
RBC: 4.62 MIL/uL (ref 3.80–5.20)
RDW: 14.3 % (ref 11.5–14.5)
WBC: 7.6 10*3/uL (ref 3.6–11.0)

## 2017-02-14 LAB — POCT PREGNANCY, URINE: Preg Test, Ur: NEGATIVE

## 2017-02-14 LAB — URINALYSIS, COMPLETE (UACMP) WITH MICROSCOPIC
Bilirubin Urine: NEGATIVE
GLUCOSE, UA: NEGATIVE mg/dL
Ketones, ur: NEGATIVE mg/dL
NITRITE: POSITIVE — AB
Protein, ur: 100 mg/dL — AB
SPECIFIC GRAVITY, URINE: 1.013 (ref 1.005–1.030)
pH: 6 (ref 5.0–8.0)

## 2017-02-14 LAB — BASIC METABOLIC PANEL
Anion gap: 5 (ref 5–15)
BUN: 18 mg/dL (ref 6–20)
CALCIUM: 9 mg/dL (ref 8.9–10.3)
CHLORIDE: 104 mmol/L (ref 101–111)
CO2: 28 mmol/L (ref 22–32)
Creatinine, Ser: 0.82 mg/dL (ref 0.44–1.00)
GFR calc Af Amer: >60 mL/min (ref >60)
GFR calc non Af Amer: >60 mL/min (ref >60)
GLUCOSE: 99 mg/dL (ref 65–99)
Potassium: 4 mmol/L (ref 3.5–5.1)
Sodium: 137 mmol/L (ref 135–145)

## 2017-02-14 MED ORDER — IOPAMIDOL (ISOVUE-370) INJECTION 76%
100.0000 mL | Freq: Once | INTRAVENOUS | Status: AC | PRN
  Administered 2017-02-14: 100 mL via INTRAVENOUS

## 2017-02-14 MED ORDER — CIPROFLOXACIN HCL 500 MG PO TABS: 500.0000 mg | ORAL_TABLET | Freq: Two times a day (BID) | ORAL | 0 refills | Status: DC

## 2017-02-14 MED ORDER — IBUPROFEN 600 MG PO TABS
600.0000 mg | ORAL_TABLET | Freq: Once | ORAL | Status: AC
  Administered 2017-02-14: 600 mg via ORAL
  Filled 2017-02-14: qty 1

## 2017-02-14 MED ORDER — CEFTRIAXONE SODIUM IN DEXTROSE 20 MG/ML IV SOLN
1.0000 g | Freq: Once | INTRAVENOUS | Status: AC
  Administered 2017-02-14: 1 g via INTRAVENOUS
  Filled 2017-02-14: qty 50

## 2017-02-14 NOTE — Discharge Instructions (Addendum)
You are evaluated for left flank pain and burning with urination, and your urine indicates urinary infection and you are being treated for 10 days with antibiotic for kidney infection called pyelonephritis.  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    Return to the emergency department immediately for any new or worsening condition including fever, vomiting cannot keep medications down, no worsening or uncontrolled pain, or any other symptoms concerning to you.

## 2017-02-14 NOTE — ED Triage Notes (Signed)
Pt states pressure in her bladder that started this am, and left flank pain as well. States urgency and frequency-denies history of kidney stones. States she has felt spasms in her bladder after urinating as well.

## 2017-02-14 NOTE — ED Provider Notes (Signed)
[facility] Emergency Department Provider Note ____________________________________________   I have reviewed the triage vital signs and the triage nursing note.  HISTORY  Chief Complaint Flank Pain   Historian Patient  HPI Megan Suarez is a 31 y.o. female presenting with left flank pain that started this morning.  She is having frequency and urgency.  Feeling of spasms in her bladder after she urinates.  Symptoms are moderate.  Also reporting intermittent shortness of breath, intermittent pleuritic chest pains on the right side.  Also reporting coughing up brown discolored mucus.  Reports history of PE about 2 years ago, no underlying or inciting cause at that time.  She did not complete course of blood thinners because she lost insurance.  Not currently on birth control.  Not having leg pain or swelling.  No recent surgeries and no recent significant travel history.     Past Medical History:  Diagnosis Date  . Dysmenorrhea 10/15/2014  . Dyspareunia 10/15/2014  . Menorrhagia with regular cycle 10/15/2014  . Pelvic pain in female 10/15/2014  . Pulmonary embolus (HCC)   . Vaginal Pap smear, abnormal     Patient Active Problem List   Diagnosis Date Noted  . Chest pain 03/12/2015  . 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 SOB (shortness of breath) 03/12/2015  . Pelvic pain in female 10/15/2014  . Dyspareunia 10/15/2014  . Menorrhagia with regular cycle 10/15/2014  . Dysmenorrhea 10/15/2014    History reviewed. No pertinent surgical history.  Prior to Admission medications   Medication Sig Start Date End Date Taking? Authorizing Provider  ciprofloxacin (CIPRO) 500 MG tablet Take 1 tablet (500 mg total) 2 (two) times daily by mouth. 02/14/17   Governor RooksLord, Levin Dagostino, MD  ibuprofen (ADVIL,MOTRIN) 800 MG tablet Take 1 tablet (800 mg total) by mouth every 8 (eight) hours as needed. Patient not taking: Reported on 02/14/2017 10/16/14   Adline PotterGriffin, Jennifer A,  NP  lamoTRIgine (LAMICTAL) 25 MG tablet Take 25 mg by mouth daily. Reported on 06/15/2015    [provider]  metroNIDAZOLE (FLAGYL) 500 MG tablet Take 1 tablet (500 mg total) by mouth 2 (two) times daily. Patient not taking: Reported on 02/14/2017 03/21/16   Doug SouJacubowitz, Sam, MD  Rivaroxaban (XARELTO STARTER PACK) 15 & 20 MG TBPK Take as directed on package: Start with one 15mg  tablet by mouth twice a day with food. On Day 22, switch to one 20mg  tablet once a day with food. Patient not taking: Reported on 02/14/2017 03/12/15   Philip AspenHernandez Acosta, Limmie PatriciaEstela Y, MD  rivaroxaban (XARELTO) 20 MG TABS tablet Take 1 tablet (20 mg total) by mouth daily with supper. Patient not taking: Reported on 06/15/2015 03/12/15   Philip AspenHernandez Acosta, Limmie PatriciaEstela Y, MD    No Known Allergies  No family history on file.  Social History Social History   Tobacco Use  . Smoking status: Former Smoker    Types: Cigarettes  . Smokeless tobacco: Never Used  Substance Use Topics  . Alcohol use: Yes    Comment: once-twice weekly  . Drug use: Yes    Frequency: 2.0 times per week    Types: Marijuana    Review of Systems  Constitutional: Negative for fever. Eyes: Negative for visual changes. ENT: Negative for sore throat. Cardiovascular: Has a for occasional pleuritic chest pain. Respiratory: Positive for occasional shortness of breath. Gastrointestinal: Negative for abdominal pain, vomiting and diarrhea. Genitourinary: Positive for dysuria. Musculoskeletal: Negative for back pain. Skin: Negative for rash. Neurological: Negative for  headache.  ____________________________________________   PHYSICAL EXAM:  VITAL SIGNS: ED Triage Vitals  Enc Vitals Group     BP 02/14/17 0956 (!) 142/97     Pulse Rate 02/14/17 0956 86     Resp 02/14/17 0956 16     Temp 02/14/17 0956 99 F (37.2 C)     Temp Source 02/14/17 0956 Oral     SpO2 02/14/17 0956 100 %     Weight 02/14/17 0956 175 lb (79.4 kg)     Height 02/14/17  0956 5\' 6"  (1.676 m)     Head Circumference --      Peak Flow --      Pain Score 02/14/17 1008 7     Pain Loc --      Pain Edu? --      Excl. in GC? --      Constitutional: Alert and oriented. Well appearing and in no distress. HEENT   Head: Normocephalic and atraumatic.      Eyes: Conjunctivae are normal. Pupils equal and round.       Ears:         Nose: No congestion/rhinnorhea.   Mouth/Throat: Mucous membranes are moist.   Neck: No stridor. Cardiovascular/Chest: Normal rate, regular rhythm.  No murmurs, rubs, or gallops. Respiratory: Normal respiratory effort without tachypnea nor retractions. Breath sounds are clear and equal bilaterally. No wheezes/rales/rhonchi. Gastrointestinal: Soft. No distention, no guarding, no rebound. Nontender.    Genitourinary/rectal:Deferred Musculoskeletal: Nontender with normal range of motion in all extremities. No joint effusions.  No lower extremity tenderness.  No edema. Neurologic:  Normal speech and language. No gross or focal neurologic deficits are appreciated. Skin:  Skin is warm, dry and intact. No rash noted. Psychiatric: Mood and affect are normal. Speech and behavior are normal. Patient exhibits appropriate insight and judgment.   ____________________________________________  LABS (pertinent positives/negatives) I, Governor Rooksebecca Jaren Vanetten, MD the attending physician have reviewed the labs noted below.  Labs Reviewed  URINALYSIS, COMPLETE (UACMP) WITH MICROSCOPIC - Abnormal; Notable for the following components:      Result Value   APPearance CLOUDY (*)    Hgb urine dipstick LARGE (*)    Protein, ur 100 (*)    Nitrite POSITIVE (*)    Leukocytes, UA MODERATE (*)    Bacteria, UA MANY (*)    Squamous Epithelial / LPF 0-5 (*)    All other components within normal limits  CBC  BASIC METABOLIC PANEL  POC URINE PREG, ED  POCT PREGNANCY, URINE    ____________________________________________    EKG I, Governor Rooksebecca Marnae Madani, MD, the  attending physician have personally viewed and interpreted all ECGs.  None ____________________________________________  RADIOLOGY All Xrays were viewed by me.  Imaging interpreted by Radiologist, and I, Governor Rooksebecca Nikkia Devoss, MD the attending physician have reviewed the radiologist interpretation noted below.  CT chest with contrast:    CLINICAL DATA: Blood clots in lungs of the year and half ago. Patient presents with chest pain and hypertension.  EXAM: CT ANGIOGRAPHY CHEST WITH CONTRAST  TECHNIQUE: Multidetector CT imaging of the chest was performed using the standard protocol during bolus administration of intravenous contrast. Multiplanar CT image reconstructions and MIPs were obtained to evaluate the vascular anatomy.  CONTRAST: 03/12/2015  COMPARISON: 03/12/2015  FINDINGS: Cardiovascular: The study is of quality for the evaluation of pulmonary embolism. There are no filling defects in the central, lobar, segmental or subsegmental pulmonary artery branches to suggest acute pulmonary embolism. Great vessels are normal in course and caliber. Normal heart  size. No significant pericardial fluid/thickening. Normal 3 vessel takeoff from the aortic arch.  Mediastinum/Nodes: No discrete thyroid nodules. Unremarkable esophagus. No pathologically enlarged axillary, mediastinal or hilar lymph nodes. No aortic dissection nor aneurysm.  Lungs/Pleura: No pneumothorax. No pleural effusion. Minimal dependent atelectasis is seen bilaterally. No dominant mass or pneumonic consolidation.  Upper abdomen: Unremarkable.  Musculoskeletal: No aggressive appearing focal osseous lesions.  Review of the MIP images confirms the above findings.  IMPRESSION: No active pulmonary disease.  No evidence of acute pulmonary embolus, aortic aneurysm or dissection      __________________________________________  PROCEDURES  Procedure(s) performed: None  Critical Care performed:  None  ____________________________________________  No current facility-administered medications on file prior to encounter.    Current Outpatient Medications on File Prior to Encounter  Medication Sig Dispense Refill  . ibuprofen (ADVIL,MOTRIN) 800 MG tablet Take 1 tablet (800 mg total) by mouth every 8 (eight) hours as needed. (Patient not taking: Reported on 02/14/2017) 60 tablet 1  . lamoTRIgine (LAMICTAL) 25 MG tablet Take 25 mg by mouth daily. Reported on 06/15/2015    . metroNIDAZOLE (FLAGYL) 500 MG tablet Take 1 tablet (500 mg total) by mouth 2 (two) times daily. (Patient not taking: Reported on 02/14/2017) 14 tablet 0  . Rivaroxaban (XARELTO STARTER PACK) 15 & 20 MG TBPK Take as directed on package: Start with one 15mg  tablet by mouth twice a day with food. On Day 22, switch to one 20mg  tablet once a day with food. (Patient not taking: Reported on 02/14/2017) 51 each 0  . rivaroxaban (XARELTO) 20 MG TABS tablet Take 1 tablet (20 mg total) by mouth daily with supper. (Patient not taking: Reported on 06/15/2015) 30 tablet 2    ____________________________________________  ED COURSE / ASSESSMENT AND PLAN  Pertinent labs & imaging results that were available during my care of the patient were reviewed by me and considered in my medical decision making (see chart for details).   I asked for patient to be pulled back to hallway bed given complaint of urinary symptoms, and I reviewed labs from the waiting room showing nitrite positive UTI.  Pregnant test negative.  I confirmed patient's symptoms consistent with UTI/Pilo.  Will treat for pyelonephritis given some pain up into the left flank.  No evidence or symptoms for sepsis.  We will give her her first dose of Rocephin here.  On review of systems patient did indicate that she has intermittent chest pain which is pleuritic, history of brown sputum and occasional shortness of breath with a history of prior PE that was not found to have a  certain underlying etiology, patient did not finish blood thinner is not currently on blood thinner now.  We discussed risk versus benefit chose to proceed in workup and evaluate to rule out PE.  CT reassuring.  Okay for discharge and outpatient follow-up.    DIFFERENTIAL DIAGNOSIS: Differential diagnosis includes, but is not limited to, ovarian cyst, ovarian torsion, acute appendicitis, diverticulitis, urinary tract infection/pyelonephritis, endometriosis, bowel obstruction, colitis, renal colic, gastroenteritis, hernia, fibroids, endometriosis, pregnancy related pain including ectopic pregnancy, etc.   CONSULTATIONS:  None   Patient / Family / Caregiver informed of clinical course, medical decision-making process, and agree with plan.   I discussed return precautions, follow-up instructions, and discharge instructions with patient and/or family.  Discharge Instructions : You are evaluated for left flank pain and burning with urination, and your urine indicates urinary infection and you are being treated for 10 days with antibiotic for  kidney infection called pyelonephritis.  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    Return to the emergency department immediately for any new or worsening condition including fever, vomiting cannot keep medications down, no worsening or uncontrolled pain, or any other symptoms concerning to you.  ___________________________________________   FINAL CLINICAL IMPRESSION(S) / ED DIAGNOSES   Final diagnoses:  Acute pyelonephritis              Note: This dictation was prepared with Dragon dictation. Any transcriptional errors that result from this process are unintentional    Governor Rooks, MD 02/14/17 1417

## 2017-02-14 NOTE — ED Notes (Signed)
Pt given warm blanket.

## 2017-04-10 NOTE — L&D Delivery Note (Addendum)
Patient is 32 y.o. [id number] who delivered a viable newborn female via NSVD at 0050 on 11/22/17. Head delivered ROA. Apgars 9/9. No nuchal cord present. Shoulder and body delivered in usual fashion. Infant with spontaneous cry, placed on mother's abdomen, dried and bulb suctioned. Cord clamped x 2 after 1-minute delay, and cut by family member. Cord blood drawn. Placenta delivered spontaneously with gentle cord traction. Fundus firm with massage and IV Pitocin. Perineum inspected and found to have no laceration, which was found to be hemostatic.  Delivery information: Delivery Note At 12:50 AM a viable female was delivered via Vaginal, Spontaneous (Presentation: ROA ).  APGAR: 9,9 ;weight pending kangaroo care  .   Placenta status: spontaneous 3VC .    Anesthesia: epidural Episiotomy: None Lacerations: None Suture Repair: N/A Est. Blood Loss (mL): 50  Mom to postpartum.  Baby to Couplet care / Skin to Skin.  Candis Schatzatricia Dell 11/22/2017, 1:10 AM  OB FELLOW DELIVERY ATTESTATION  I was gloved and present for the delivery in its entirety, and I agree with the above resident's note.    Baby with variable and late decels to 90 bpm lasting less than one minute, with two decels to 60 bpm, with contractions during second stage of labor. Continued to have moderate variability and good recovery of baseline HR between each ctx. Mother given supplemental O2 and baby given scalp stimulation. Baby delivered in usual fashion with ROA presentation without nuchal cord. Short cord noted at time of delivery.  Infant with spontaneous cry, placed on mother's abdomen, dried and bulb suctioned. Cord clamped x 2 after 1-minute delay, and cut by family member. Cord blood drawn.  Spontaneous, intact placenta delivered. Unable to obtain adequate cord pH sample. Apgars 9,9. Placenta sent to pathology. No lacerations noted. Mother to postpartum. Baby to couplet care.   Marcy Sirenatherine Brylynn Hanssen, D.O. OB Fellow  11/22/2017, 1:37 AM

## 2017-05-03 ENCOUNTER — Telehealth: Payer: Self-pay | Admitting: *Deleted

## 2017-05-03 NOTE — Telephone Encounter (Signed)
Patient called stating she just moved back to [place] yesterday and [place] out while [place]. She states she is 10wk and found out she was pregnant in [place] when she went to the ER there for bleeding. Patient is requesting to be seen to make sure everything is ok since she fell. Informed patient that since we have not seen her for this pregnancy, we would need the records to verify she was pregnant and how far along. Advised patient to come by our office to sign release so we could get records and set up appointment. Also encouraged patient to push fluids and eat small frequent meals.  Pt verbalized understanding.

## 2017-05-08 ENCOUNTER — Telehealth: Payer: Self-pay | Admitting: Obstetrics & Gynecology

## 2017-05-08 NOTE — Telephone Encounter (Signed)
LMOVM returning patient's call.  

## 2017-05-08 NOTE — Telephone Encounter (Signed)
Informed patient that we have not received her recorded from [facility]. Patient states she has a copy of her records so she will bring them by the office for us to review.

## 2017-05-08 NOTE — Telephone Encounter (Signed)
Pt called stating that she was returning Tish's phone call. Please contact pt

## 2017-05-09 ENCOUNTER — Encounter: Payer: Self-pay | Admitting: *Deleted

## 2017-05-22 ENCOUNTER — Encounter: Payer: Medicaid Other | Admitting: Advanced Practice Midwife

## 2017-05-22 ENCOUNTER — Ambulatory Visit: Payer: Self-pay | Admitting: *Deleted

## 2017-07-16 ENCOUNTER — Encounter: Payer: Self-pay | Admitting: Women's Health

## 2017-07-16 ENCOUNTER — Ambulatory Visit: Payer: Medicaid Other | Admitting: *Deleted

## 2017-07-16 ENCOUNTER — Ambulatory Visit (INDEPENDENT_AMBULATORY_CARE_PROVIDER_SITE_OTHER): Payer: Medicaid Other | Admitting: Women's Health

## 2017-07-16 ENCOUNTER — Other Ambulatory Visit (INDEPENDENT_AMBULATORY_CARE_PROVIDER_SITE_OTHER): Payer: Medicaid Other

## 2017-07-16 ENCOUNTER — Other Ambulatory Visit: Payer: Self-pay | Admitting: Women's Health

## 2017-07-16 ENCOUNTER — Other Ambulatory Visit (HOSPITAL_COMMUNITY)
Admission: RE | Admit: 2017-07-16 | Discharge: 2017-07-16 | Disposition: A | Discharge status: Home / Self Care | Payer: Medicaid Other | Source: Ambulatory Visit | Attending: Advanced Practice Midwife | Admitting: Advanced Practice Midwife

## 2017-07-16 VITALS — BP 110/74 | HR 81 | Wt 171.0 lb

## 2017-07-16 DIAGNOSIS — Z86711 Personal history of pulmonary embolism: Secondary | ICD-10-CM | POA: Diagnosis not present

## 2017-07-16 DIAGNOSIS — O0990 Supervision of high risk pregnancy, unspecified, unspecified trimester: Secondary | ICD-10-CM | POA: Insufficient documentation

## 2017-07-16 DIAGNOSIS — Z3A20 20 weeks gestation of pregnancy: Secondary | ICD-10-CM | POA: Diagnosis not present

## 2017-07-16 DIAGNOSIS — Z3402 Encounter for supervision of normal first pregnancy, second trimester: Secondary | ICD-10-CM

## 2017-07-16 DIAGNOSIS — Z1389 Encounter for screening for other disorder: Secondary | ICD-10-CM

## 2017-07-16 DIAGNOSIS — O0932 Supervision of pregnancy with insufficient antenatal care, second trimester: Secondary | ICD-10-CM

## 2017-07-16 DIAGNOSIS — O9989 Other specified diseases and conditions complicating pregnancy, childbirth and the puerperium: Secondary | ICD-10-CM | POA: Diagnosis not present

## 2017-07-16 DIAGNOSIS — Z331 Pregnant state, incidental: Secondary | ICD-10-CM

## 2017-07-16 DIAGNOSIS — O9A312 Physical abuse complicating pregnancy, second trimester: Secondary | ICD-10-CM

## 2017-07-16 DIAGNOSIS — Z363 Encounter for antenatal screening for malformations: Secondary | ICD-10-CM | POA: Diagnosis not present

## 2017-07-16 DIAGNOSIS — Z3482 Encounter for supervision of other normal pregnancy, second trimester: Secondary | ICD-10-CM

## 2017-07-16 DIAGNOSIS — T7411XA Adult physical abuse, confirmed, initial encounter: Secondary | ICD-10-CM | POA: Insufficient documentation

## 2017-07-16 DIAGNOSIS — Z124 Encounter for screening for malignant neoplasm of cervix: Secondary | ICD-10-CM | POA: Diagnosis not present

## 2017-07-16 LAB — POCT URINALYSIS DIPSTICK
Blood, UA: NEGATIVE
GLUCOSE UA: NEGATIVE
Ketones, UA: NEGATIVE
LEUKOCYTES UA: NEGATIVE
Nitrite, UA: NEGATIVE
Protein, UA: NEGATIVE

## 2017-07-16 MED ORDER — ENOXAPARIN SODIUM 40 MG/0.4ML ~~LOC~~ SOLN: 40.0000 mg | SUBCUTANEOUS | 6 refills | Status: DC

## 2017-07-16 NOTE — Progress Notes (Addendum)
US 20+2 wks,cx 4.2 cm,breech,left lateral pl gr 0,normal ovaries bilat,svp of fluid 6.3 cm,fhr 152 bpm,efw 331 g 33%,no obvious abnormalities,please have pt come back for additional images of the heart because of fetal position.

## 2017-07-16 NOTE — Patient Instructions (Signed)
Megan LacyJalisa R Suarez, I greatly value your feedback.  If you receive a survey following your visit with us today, we appreciate you taking the time to fill it out.  Thanks, Joellyn HaffKim Ruairi Stutsman, CNM, WHNP-BC   Second Trimester of Pregnancy The second trimester is from week 14 through week 27 (months 4 through 6). The second trimester is often a time when you feel your best. Your body has adjusted to being pregnant, and you begin to feel better physically. Usually, morning sickness has lessened or quit completely, you may have more energy, and you may have an increase in appetite. The second trimester is also a time when the fetus is growing rapidly. At the end of the sixth month, the fetus is about 9 inches long and weighs about 1 pounds. You will likely begin to feel the baby move (quickening) between 16 and 20 weeks of pregnancy. Body changes during your second trimester Your body continues to go through many changes during your second trimester. The changes vary from woman to woman.  Your weight will continue to increase. You will notice your lower abdomen bulging out.  You may begin to get stretch marks on your hips, abdomen, and breasts.  You may develop headaches that can be relieved by medicines. The medicines should be approved by your health care provider.  You may urinate more often because the fetus is pressing on your bladder.  You may develop or continue to have heartburn as a result of your pregnancy.  You may develop constipation because certain hormones are causing the muscles that push waste through your intestines to slow down.  You may develop hemorrhoids or swollen, bulging veins (varicose veins).  You may have back pain. This is caused by: ? Weight gain. ? Pregnancy hormones that are relaxing the joints in your pelvis. ? A shift in weight and the muscles that support your balance.  Your breasts will continue to grow and they will continue to become tender.  Your gums may bleed  and may be sensitive to brushing and flossing.  Dark spots or blotches (chloasma, mask of pregnancy) may develop on your face. This will likely fade after the baby is born.  A dark line from your belly button to the pubic area (linea nigra) may appear. This will likely fade after the baby is born.  You may have changes in your hair. These can include thickening of your hair, rapid growth, and changes in texture. Some women also have hair loss during or after pregnancy, or hair that feels dry or thin. Your hair will most likely return to normal after your baby is born.  What to expect at prenatal visits During a routine prenatal visit:  You will be weighed to make sure you and the fetus are growing normally.  Your blood pressure will be taken.  Your abdomen will be measured to track your baby's growth.  The fetal heartbeat will be listened to.  Any test results from the previous visit will be discussed.  Your health care provider may ask you:  How you are feeling.  If you are feeling the baby move.  If you have had any abnormal symptoms, such as leaking fluid, bleeding, severe headaches, or abdominal cramping.  If you are using any tobacco products, including cigarettes, chewing tobacco, and electronic cigarettes.  If you have any questions.  Other tests that may be performed during your second trimester include:  Blood tests that check for: ? Low iron levels (anemia). ? High  blood sugar that affects pregnant women (gestational diabetes) between 3 and 28 weeks. ? Rh antibodies. This is to check for a protein on red blood cells (Rh factor).  Urine tests to check for infections, diabetes, or protein in the urine.  An ultrasound to confirm the proper growth and development of the baby.  An amniocentesis to check for possible genetic problems.  Fetal screens for spina bifida and Down syndrome.  HIV (human immunodeficiency virus) testing. Routine prenatal testing includes  screening for HIV, unless you choose not to have this test.  Follow these instructions at home: Medicines  Follow your health care provider's instructions regarding medicine use. Specific medicines may be either safe or unsafe to take during pregnancy.  Take a prenatal vitamin that contains at least 600 micrograms (mcg) of folic acid.  If you develop constipation, try taking a stool softener if your health care provider approves. Eating and drinking  Eat a balanced diet that includes fresh fruits and vegetables, whole grains, good sources of protein such as meat, eggs, or tofu, and low-fat dairy. Your health care provider will help you determine the amount of weight gain that is right for you.  Avoid raw meat and uncooked cheese. These carry germs that can cause birth defects in the baby.  If you have low calcium intake from food, talk to your health care provider about whether you should take a daily calcium supplement.  Limit foods that are high in fat and processed sugars, such as fried and sweet foods.  To prevent constipation: ? Drink enough fluid to keep your urine clear or pale yellow. ? Eat foods that are high in fiber, such as fresh fruits and vegetables, whole grains, and beans. Activity  Exercise only as directed by your health care provider. Most women can continue their usual exercise routine during pregnancy. Try to exercise for 30 minutes at least 5 days a week. Stop exercising if you experience uterine contractions.  Avoid heavy lifting, wear low heel shoes, and practice good posture.  A sexual relationship may be continued unless your health care provider directs you otherwise. Relieving pain and discomfort  Wear a good support bra to prevent discomfort from breast tenderness.  Take warm sitz baths to soothe any pain or discomfort caused by hemorrhoids. Use hemorrhoid cream if your health care provider approves.  Rest with your legs elevated if you have leg cramps  or low back pain.  If you develop varicose veins, wear support hose. Elevate your feet for 15 minutes, 3-4 times a day. Limit salt in your diet. Prenatal Care  Write down your questions. Take them to your prenatal visits.  Keep all your prenatal visits as told by your health care provider. This is important. Safety  Wear your seat belt at all times when driving.  Make a list of emergency phone numbers, including numbers for family, friends, the hospital, and police and fire departments. General instructions  Ask your health care provider for a referral to a local prenatal education class. Begin classes no later than the beginning of month 6 of your pregnancy.  Ask for help if you have counseling or nutritional needs during pregnancy. Your health care provider can offer advice or refer you to specialists for help with various needs.  Do not use hot tubs, steam rooms, or saunas.  Do not douche or use tampons or scented sanitary pads.  Do not cross your legs for long periods of time.  Avoid cat litter boxes and soil  used by cats. These carry germs that can cause birth defects in the baby and possibly loss of the fetus by miscarriage or stillbirth.  Avoid all smoking, herbs, alcohol, and unprescribed drugs. Chemicals in these products can affect the formation and growth of the baby.  Do not use any products that contain nicotine or tobacco, such as cigarettes and e-cigarettes. If you need help quitting, ask your health care provider.  Visit your dentist if you have not gone yet during your pregnancy. Use a soft toothbrush to brush your teeth and be gentle when you floss. Contact a health care provider if:  You have dizziness.  You have mild pelvic cramps, pelvic pressure, or nagging pain in the abdominal area.  You have persistent nausea, vomiting, or diarrhea.  You have a bad smelling vaginal discharge.  You have pain when you urinate. Get help right away if:  You have a  fever.  You are leaking fluid from your vagina.  You have spotting or bleeding from your vagina.  You have severe abdominal cramping or pain.  You have rapid weight gain or weight loss.  You have shortness of breath with chest pain.  You notice sudden or extreme swelling of your face, hands, ankles, feet, or legs.  You have not felt your baby move in over an hour.  You have severe headaches that do not go away when you take medicine.  You have vision changes. Summary  The second trimester is from week 14 through week 27 (months 4 through 6). It is also a time when the fetus is growing rapidly.  Your body goes through many changes during pregnancy. The changes vary from woman to woman.  Avoid all smoking, herbs, alcohol, and unprescribed drugs. These chemicals affect the formation and growth your baby.  Do not use any tobacco products, such as cigarettes, chewing tobacco, and e-cigarettes. If you need help quitting, ask your health care provider.  Contact your health care provider if you have any questions. Keep all prenatal visits as told by your health care provider. This is important. This information is not intended to replace advice given to you by your health care provider. Make sure you discuss any questions you have with your health care provider. Document Released: 03/21/2001 Document Revised: 09/02/2015 Document Reviewed: 05/28/2012 Elsevier Interactive Patient Education  2017 [employer].
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
## 2017-07-17 LAB — URINALYSIS, ROUTINE W REFLEX MICROSCOPIC
BILIRUBIN UA: NEGATIVE
Glucose, UA: NEGATIVE
KETONES UA: NEGATIVE
Leukocytes, UA: NEGATIVE
Nitrite, UA: NEGATIVE
PH UA: 6 (ref 5.0–7.5)
PROTEIN UA: NEGATIVE
RBC UA: NEGATIVE
SPEC GRAV UA: 1.025 (ref 1.005–1.030)
UUROB: 0.2 mg/dL (ref 0.2–1.0)

## 2017-07-17 LAB — OBSTETRIC PANEL, INCLUDING HIV
Antibody Screen: NEGATIVE
BASOS ABS: 0 10*3/uL (ref 0.0–0.2)
Basos: 0 %
EOS (ABSOLUTE): 0.1 10*3/uL (ref 0.0–0.4)
Eos: 1 %
HEP B S AG: NEGATIVE
HIV Screen 4th Generation wRfx: NONREACTIVE
Hematocrit: 32.8 % — ABNORMAL LOW (ref 34.0–46.6)
Hemoglobin: 10.5 g/dL — ABNORMAL LOW (ref 11.1–15.9)
IMMATURE GRANULOCYTES: 1 %
Immature Grans (Abs): 0.1 10*3/uL (ref 0.0–0.1)
Lymphocytes Absolute: 1.5 10*3/uL (ref 0.7–3.1)
Lymphs: 16 %
MCH: 27.4 pg (ref 26.6–33.0)
MCHC: 32 g/dL (ref 31.5–35.7)
MCV: 86 fL (ref 79–97)
MONOCYTES: 7 %
Monocytes Absolute: 0.6 10*3/uL (ref 0.1–0.9)
NEUTROS PCT: 75 %
Neutrophils Absolute: 6.7 10*3/uL (ref 1.4–7.0)
PLATELETS: 234 10*3/uL (ref 150–379)
RBC: 3.83 x10E6/uL (ref 3.77–5.28)
RDW: 14 % (ref 12.3–15.4)
RPR: NONREACTIVE
RUBELLA: 8.03 {index} (ref >0.99)
Rh Factor: POSITIVE
WBC: 8.9 10*3/uL (ref 3.4–10.8)

## 2017-07-17 LAB — SICKLE CELL SCREEN: SICKLE CELL SCREEN: NEGATIVE

## 2017-07-18 ENCOUNTER — Encounter: Payer: Self-pay | Admitting: Women's Health

## 2017-07-18 DIAGNOSIS — F1290 Cannabis use, unspecified, uncomplicated: Secondary | ICD-10-CM | POA: Insufficient documentation

## 2017-07-18 LAB — PMP SCREEN PROFILE (10S), URINE
Amphetamine Scrn, Ur: NEGATIVE ng/mL
BARBITURATE SCREEN URINE: NEGATIVE ng/mL
BENZODIAZEPINE SCREEN, URINE: NEGATIVE ng/mL
CANNABINOIDS UR QL SCN: POSITIVE ng/mL — AB
COCAINE(METAB.)SCREEN, URINE: NEGATIVE ng/mL
Creatinine(Crt), U: 209.4 mg/dL (ref 20.0–300.0)
METHADONE SCREEN, URINE: NEGATIVE ng/mL
OPIATE SCREEN URINE: NEGATIVE ng/mL
OXYCODONE+OXYMORPHONE UR QL SCN: NEGATIVE ng/mL
PROPOXYPHENE SCREEN URINE: NEGATIVE ng/mL
Ph of Urine: 5.6 (ref 4.5–8.9)
Phencyclidine Qn, Ur: NEGATIVE ng/mL

## 2017-07-18 LAB — URINE CULTURE

## 2017-07-18 LAB — CYTOLOGY - PAP
Chlamydia: NEGATIVE
Diagnosis: NEGATIVE
HPV: NOT DETECTED
NEISSERIA GONORRHEA: NEGATIVE

## 2017-07-20 ENCOUNTER — Other Ambulatory Visit: Payer: Self-pay | Admitting: Women's Health

## 2017-07-20 DIAGNOSIS — O99891 Other specified diseases and conditions complicating pregnancy: Secondary | ICD-10-CM | POA: Insufficient documentation

## 2017-07-20 DIAGNOSIS — R8271 Bacteriuria: Secondary | ICD-10-CM

## 2017-07-20 DIAGNOSIS — O9989 Other specified diseases and conditions complicating pregnancy, childbirth and the puerperium: Principal | ICD-10-CM

## 2017-07-20 MED ORDER — NITROFURANTOIN MONOHYD MACRO 100 MG PO CAPS: 100.0000 mg | ORAL_CAPSULE | Freq: Two times a day (BID) | ORAL | 0 refills | Status: DC

## 2017-07-25 ENCOUNTER — Other Ambulatory Visit: Payer: Self-pay | Admitting: Women's Health

## 2017-07-25 ENCOUNTER — Encounter: Payer: Self-pay | Admitting: *Deleted

## 2017-07-25 ENCOUNTER — Telehealth: Payer: Self-pay | Admitting: Women's Health

## 2017-07-25 ENCOUNTER — Telehealth: Payer: Self-pay | Admitting: *Deleted

## 2017-07-25 DIAGNOSIS — Z86711 Personal history of pulmonary embolism: Secondary | ICD-10-CM

## 2017-07-25 MED ORDER — ENOXAPARIN SODIUM 40 MG/0.4ML ~~LOC~~ SOLN: 80.0000 mg | SUBCUTANEOUS | 6 refills | Status: DC

## 2017-07-25 NOTE — Telephone Encounter (Signed)
Attempted to call patient but number not working/

## 2017-07-25 NOTE — Telephone Encounter (Signed)
LM for pt to return call. Need to discuss increase in Lovenox, as well as +urine cx- has not read [facility] message.  Cheral MarkerKimberly R. Eulia Hatcher, CNM, [facility]NP-BC 07/25/2017 8:37 AM

## 2017-07-26 ENCOUNTER — Telehealth: Payer: Self-pay | Admitting: Women's Health

## 2017-07-26 NOTE — Telephone Encounter (Signed)
Returned call. Pt not available, left message for her to call us back by 5pm today, have been trying to contact to notify of uti, lovenox dosage change, and u/s f/u.  Cheral MarkerKimberly R. Winfred Iiams, CNM, [facility]NP-BC 07/26/2017 1:41 PM

## 2017-07-26 NOTE — Telephone Encounter (Signed)
Patient called stating that she has gotten an alert from her Mychart stating that she had some results from her urine. Pt states that she has a UTI and she did not know that. Pt would like a call back from [facility] or a nurse.

## 2017-07-30 ENCOUNTER — Telehealth: Payer: Self-pay | Admitting: Women's Health

## 2017-07-30 DIAGNOSIS — Z0489 Encounter for examination and observation for other specified reasons: Secondary | ICD-10-CM

## 2017-07-30 DIAGNOSIS — IMO0002 Reserved for concepts with insufficient information to code with codable children: Secondary | ICD-10-CM

## 2017-07-30 NOTE — Telephone Encounter (Signed)
Pt returned call! Phone has been messed up. Notified of +urine cx, antibiotic sent to pharmacy, importance of picking up and taking all as directed. Notified of increased dosage of Lovenox to 80mg  daily- importance of picking up asap and starting. Switched to front to schedule f/u u/s for images of heart (no visit). Keep next visit as scheduled 5/6.  Cheral MarkerKimberly R. Chlora Mcbain, CNM, [facility]NP[facility]BC 07/30/2017 2:08 PM

## 2017-07-31 ENCOUNTER — Ambulatory Visit (INDEPENDENT_AMBULATORY_CARE_PROVIDER_SITE_OTHER): Payer: Medicaid Other

## 2017-07-31 DIAGNOSIS — Z3A22 22 weeks gestation of pregnancy: Secondary | ICD-10-CM

## 2017-07-31 DIAGNOSIS — Z3402 Encounter for supervision of normal first pregnancy, second trimester: Secondary | ICD-10-CM

## 2017-07-31 DIAGNOSIS — Z0489 Encounter for examination and observation for other specified reasons: Secondary | ICD-10-CM

## 2017-07-31 DIAGNOSIS — O0932 Supervision of pregnancy with insufficient antenatal care, second trimester: Secondary | ICD-10-CM

## 2017-07-31 DIAGNOSIS — IMO0002 Reserved for concepts with insufficient information to code with codable children: Secondary | ICD-10-CM

## 2017-07-31 NOTE — Progress Notes (Addendum)
US 22+3 wks,cephalic,cx 3.6 cm,posterior gr 0,svp of fluid 4 cm,normal ovaries bilat,fhr 138 bpm,EFW 474 g 27%,anatomy of the heart complete,LVEICF

## 2017-08-13 ENCOUNTER — Ambulatory Visit (INDEPENDENT_AMBULATORY_CARE_PROVIDER_SITE_OTHER): Payer: Medicaid Other | Admitting: Obstetrics & Gynecology

## 2017-08-13 ENCOUNTER — Encounter: Payer: Self-pay | Admitting: Obstetrics & Gynecology

## 2017-08-13 VITALS — BP 118/70 | HR 84 | Wt 183.0 lb

## 2017-08-13 DIAGNOSIS — Z3482 Encounter for supervision of other normal pregnancy, second trimester: Secondary | ICD-10-CM

## 2017-08-13 DIAGNOSIS — Z3A24 24 weeks gestation of pregnancy: Secondary | ICD-10-CM

## 2017-08-13 DIAGNOSIS — Z86711 Personal history of pulmonary embolism: Secondary | ICD-10-CM

## 2017-08-13 NOTE — Progress Notes (Signed)
G2P0010 [redacted]w[redacted]d Estimated Date of Delivery: 12/01/17  Blood pressure 118/70, pulse 84, weight 183 lb (83 kg), last menstrual period 02/24/2017.   BP weight and urine results all reviewed and noted.  Please refer to the obstetrical flow sheet for the fundal height and fetal heart rate documentation:  Patient reports good fetal movement, denies any bleeding and no rupture of membranes symptoms or regular contractions. Patient is without complaints. All questions were answered.  No orders of the defined types were placed in this encounter.   Plan:  Continued routine obstetrical care, taking lovenox 80 daily PN2 next visit  Return in about 1 month (around 09/10/2017) for PN2, , LROB.

## 2017-09-10 ENCOUNTER — Other Ambulatory Visit: Payer: Medicaid Other

## 2017-09-10 ENCOUNTER — Encounter: Payer: Medicaid Other | Admitting: Women's Health

## 2017-09-21 ENCOUNTER — Ambulatory Visit (INDEPENDENT_AMBULATORY_CARE_PROVIDER_SITE_OTHER): Payer: Medicaid Other | Admitting: Women's Health

## 2017-09-21 ENCOUNTER — Other Ambulatory Visit: Payer: Medicaid Other

## 2017-09-21 ENCOUNTER — Encounter: Payer: Self-pay | Admitting: Women's Health

## 2017-09-21 VITALS — BP 144/78 | HR 92 | Wt 192.3 lb

## 2017-09-21 DIAGNOSIS — Z131 Encounter for screening for diabetes mellitus: Secondary | ICD-10-CM

## 2017-09-21 DIAGNOSIS — Z3A29 29 weeks gestation of pregnancy: Secondary | ICD-10-CM

## 2017-09-21 DIAGNOSIS — R030 Elevated blood-pressure reading, without diagnosis of hypertension: Secondary | ICD-10-CM

## 2017-09-21 DIAGNOSIS — Z3483 Encounter for supervision of other normal pregnancy, third trimester: Secondary | ICD-10-CM

## 2017-09-21 DIAGNOSIS — Z1389 Encounter for screening for other disorder: Secondary | ICD-10-CM

## 2017-09-21 DIAGNOSIS — Z331 Pregnant state, incidental: Secondary | ICD-10-CM

## 2017-09-21 DIAGNOSIS — O9989 Other specified diseases and conditions complicating pregnancy, childbirth and the puerperium: Secondary | ICD-10-CM

## 2017-09-21 DIAGNOSIS — R8271 Bacteriuria: Secondary | ICD-10-CM

## 2017-09-21 DIAGNOSIS — O283 Abnormal ultrasonic finding on antenatal screening of mother: Secondary | ICD-10-CM

## 2017-09-21 LAB — POCT URINALYSIS DIPSTICK
Blood, UA: NEGATIVE
Glucose, UA: NEGATIVE
Ketones, UA: NEGATIVE
LEUKOCYTES UA: NEGATIVE
Nitrite, UA: NEGATIVE
Protein, UA: POSITIVE — AB

## 2017-09-21 NOTE — Patient Instructions (Addendum)
Megan Suarez, I greatly value your feedback.  If you receive a survey following your visit with [place] today, we appreciate you taking the time to fill it out.  Thanks, Joellyn Haff, CNM, WHNP-BC   Call the office [phone]) or go to [facility] if:  You begin to have strong, frequent contractions  Your water breaks.  Sometimes it is a big gush of fluid, sometimes it is just a trickle that keeps getting your panties wet or running down your legs  You have vaginal bleeding.  It is normal to have a small amount of spotting if your cervix was checked.   You don't feel your baby moving like normal.  If you don't, get you something to eat and drink and lay down and focus on feeling your baby move.  You should feel at least 10 movements in 2 hours.  If you don't, you should call the office or go to [facility].    Call the office [phone]) or go to [facility] hospital for these signs of pre-eclampsia:  Severe headache that does not go away with Tylenol  Visual changes- seeing spots, double, blurred vision  Pain under your right breast or upper abdomen that does not go away with Tums or heartburn medicine  Nausea and/or vomiting  Severe swelling in your hands, feet, and face      Tdap Vaccine  It is recommended that you get the Tdap vaccine during the third trimester of EACH pregnancy to help protect your baby from getting pertussis (whooping cough)  27-36 weeks is the BEST time to do this so that you can pass the protection on to your baby. During pregnancy is better than after pregnancy, but if you are unable to get it during pregnancy it will be offered at the hospital.   You can get this vaccine at the health department or your family doctor  Everyone who will be around your baby should also be up-to-date on their vaccines. Adults (who are not pregnant) only need 1 dose of Tdap during adulthood.   Third Trimester of Pregnancy The third trimester is from week 29 through week  42, months 7 through 9. The third trimester is a time when the fetus is growing rapidly. At the end of the ninth month, the fetus is about 20 inches in length and weighs 6-10 pounds.  BODY CHANGES Your body goes through many changes during pregnancy. The changes vary from woman to woman.   Your weight will continue to increase. You can expect to gain 25-35 pounds (11-16 kg) by the end of the pregnancy.  You may begin to get stretch marks on your hips, abdomen, and breasts.  You may urinate more often because the fetus is moving lower into your pelvis and pressing on your bladder.  You may develop or continue to have heartburn as a result of your pregnancy.  You may develop constipation because certain hormones are causing the muscles that push waste through your intestines to slow down.  You may develop hemorrhoids or swollen, bulging veins (varicose veins).  You may have pelvic pain because of the weight gain and pregnancy hormones relaxing your joints between the bones in your pelvis. Backaches may result from overexertion of the muscles supporting your posture.  You may have changes in your hair. These can include thickening of your hair, rapid growth, and changes in texture. Some women also have hair loss during or after pregnancy, or hair that feels dry or thin. Your hair  will most likely return to normal after your baby is born.  Your breasts will continue to grow and be tender. A yellow discharge may leak from your breasts called colostrum.  Your belly button may stick out.  You may feel short of breath because of your expanding uterus.  You may notice the fetus "dropping," or moving lower in your abdomen.  You may have a bloody mucus discharge. This usually occurs a few days to a week before labor begins.  Your cervix becomes thin and soft (effaced) near your due date. WHAT TO EXPECT AT YOUR PRENATAL EXAMS  You will have prenatal exams every 2 weeks until week 36. Then, you  will have weekly prenatal exams. During a routine prenatal visit:  You will be weighed to make sure you and the fetus are growing normally.  Your blood pressure is taken.  Your abdomen will be measured to track your baby's growth.  The fetal heartbeat will be listened to.  Any test results from the previous visit will be discussed.  You may have a cervical check near your due date to see if you have effaced. At around 36 weeks, your caregiver will check your cervix. At the same time, your caregiver will also perform a test on the secretions of the vaginal tissue. This test is to determine if a type of bacteria, Group B streptococcus, is present. Your caregiver will explain this further. Your caregiver may ask you:  What your birth plan is.  How you are feeling.  If you are feeling the baby move.  If you have had any abnormal symptoms, such as leaking fluid, bleeding, severe headaches, or abdominal cramping.  If you have any questions. Other tests or screenings that may be performed during your third trimester include:  Blood tests that check for low iron levels (anemia).  Fetal testing to check the health, activity level, and growth of the fetus. Testing is done if you have certain medical conditions or if there are problems during the pregnancy. FALSE LABOR You may feel small, irregular contractions that eventually go away. These are called Braxton Hicks contractions, or false labor. Contractions may last for hours, days, or even weeks before true labor sets in. If contractions come at regular intervals, intensify, or become painful, it is best to be seen by your caregiver.  SIGNS OF LABOR   Menstrual-like cramps.  Contractions that are 5 minutes apart or less.  Contractions that start on the top of the uterus and spread down to the lower abdomen and back.  A sense of increased pelvic pressure or back pain.  A watery or bloody mucus discharge that comes from the vagina. If  you have any of these signs before the 37th week of pregnancy, call your caregiver right away. You need to go to the hospital to get checked immediately. HOME CARE INSTRUCTIONS   Avoid all smoking, herbs, alcohol, and unprescribed drugs. These chemicals affect the formation and growth of the baby.  Follow your caregiver's instructions regarding medicine use. There are medicines that are either safe or unsafe to take during pregnancy.  Exercise only as directed by your caregiver. Experiencing uterine cramps is a good sign to stop exercising.  Continue to eat regular, healthy meals.  Wear a good support bra for breast tenderness.  Do not use hot tubs, steam rooms, or saunas.  Wear your seat belt at all times when driving.  Avoid raw meat, uncooked cheese, cat litter boxes, and soil used by cats.  These carry germs that can cause birth defects in the baby.  Take your prenatal vitamins.  Try taking a stool softener (if your caregiver approves) if you develop constipation. Eat more high-fiber foods, such as fresh vegetables or fruit and whole grains. Drink plenty of fluids to keep your urine clear or pale yellow.  Take warm sitz baths to soothe any pain or discomfort caused by hemorrhoids. Use hemorrhoid cream if your caregiver approves.  If you develop varicose veins, wear support hose. Elevate your feet for 15 minutes, 3-4 times a day. Limit salt in your diet.  Avoid heavy lifting, wear low heal shoes, and practice good posture.  Rest a lot with your legs elevated if you have leg cramps or low back pain.  Visit your dentist if you have not gone during your pregnancy. Use a soft toothbrush to brush your teeth and be gentle when you floss.  A sexual relationship may be continued unless your caregiver directs you otherwise.  Do not travel far distances unless it is absolutely necessary and only with the approval of your caregiver.  Take prenatal classes to understand, practice, and ask  questions about the labor and delivery.  Make a trial run to the hospital.  Pack your hospital bag.  Prepare the baby's nursery.  Continue to go to all your prenatal visits as directed by your caregiver. SEEK MEDICAL CARE IF:  You are unsure if you are in labor or if your water has broken.  You have dizziness.  You have mild pelvic cramps, pelvic pressure, or nagging pain in your abdominal area.  You have persistent nausea, vomiting, or diarrhea.  You have a bad smelling vaginal discharge.  You have pain with urination. SEEK IMMEDIATE MEDICAL CARE IF:   You have a fever.  You are leaking fluid from your vagina.  You have spotting or bleeding from your vagina.  You have severe abdominal cramping or pain.  You have rapid weight loss or gain.  You have shortness of breath with chest pain.  You notice sudden or extreme swelling of your face, hands, ankles, feet, or legs.  You have not felt your baby move in over an hour.  You have severe headaches that do not go away with medicine.  You have vision changes. Document Released: 03/21/2001 Document Revised: 04/01/2013 Document Reviewed: 05/28/2012 [facility] Patient Information 2015 [place], [place]. This information is not intended to replace advice given to you by your health care provider. Make sure you discuss any questions you have with your health care provider.

## 2017-09-21 NOTE — Progress Notes (Signed)
LOW-RISK PREGNANCY VISIT Patient name: Megan Suarez MRN [id number]  Date of birth: [birth year] Chief Complaint:   low risk ob (PN2)  History of Present Illness:   [name] is a 32 y.o. G27P0010 female at [redacted]w[redacted]d with an Estimated Date of Delivery: 12/01/17 being seen today for ongoing management of a low-risk pregnancy.  Today she reports no complaints. BP elevated today. States she has never been dx w/ HTN or on meds, BP would be occ elevated at MDs, but then would go down on retake. Denies ha, visual changes, ruq/epigastric pain, n/v. 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   Contractions: Not present.  .  Movement: Present. denies leaking of fluid. Review of Systems:   Pertinent items are noted in HPI Denies abnormal vaginal discharge w/ itching/odor/irritation, headaches, visual changes, shortness of breath, chest pain, abdominal pain, severe nausea/vomiting, or problems with urination or bowel movements unless otherwise stated above. Pertinent History Reviewed:  Reviewed past medical,surgical, social, obstetrical and family history.  Reviewed problem list, medications and allergies. Physical Assessment:   Vitals:   09/21/17 0938 09/21/17 1005  BP: 140/86 (!) 144/78  Pulse: 92   Weight: 192 lb 4.8 oz (87.2 kg)   Body mass index is 31.04 kg/m.        Physical Examination:   General appearance: Well appearing, and in no distress  Mental status: Alert, oriented to person, place, and time  Skin: Warm & dry  Cardiovascular: Normal heart rate noted  Respiratory: Normal respiratory effort, no distress  Abdomen: Soft, gravid, nontender  Pelvic: Cervical exam deferred         Extremities: Edema: None  Fetal Status: Fetal Heart Rate (bpm): 135 Fundal Height: 29 cm Movement: Present    Results for orders placed or performed in visit on 09/21/17 (from the past 24 hour(s))    POCT urinalysis dipstick   Collection Time: 09/21/17  9:46 AM  Result Value Ref Range   Color, UA     Clarity, UA     Glucose, UA Negative Negative   Bilirubin, UA     Ketones, UA neg    Spec Grav, UA  1.010 - 1.025   Blood, UA neg    pH, UA  5.0 - 8.0   Protein, UA Positive (A) Negative   Urobilinogen, UA  0.2 or 1.0 E.U./dL   Nitrite, UA neg    Leukocytes, UA Negative Negative   Appearance     Odor      Assessment & Plan:  1) Low-risk pregnancy G2P0010 at [redacted]w[redacted]d with an Estimated Date of Delivery: 12/01/17   2) H/O bilateral PE, continue Lovenox 80mg  daily  3) Elevated bp> tr proteinuria, asymptomatic, will get pre-e labs, f/u Mond for bp recheck, reviewed pre-e s/s, reasons to seek care  4) 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  Labs/procedures today: pn2, declines tdap today- will get next visit  Plan:  Continue routine obstetrical care   Reviewed: Preterm labor symptoms and general obstetric precautions including but not limited to vaginal bleeding, contractions, leaking of fluid and fetal movement were reviewed in detail with the patient. Recommended Tdap at HD/PCP per CDC guidelines.  All questions  were answered  Follow-up: Return for Monday for LROB.  Orders Placed This Encounter  Procedures  . Urine Culture  . informaSeq(SM) Prenatal Test  . Comprehensive metabolic panel  . Protein / creatinine ratio, urine  . POCT urinalysis dipstick   Cheral MarkerKimberly R Jayce Kainz CNM, [facility] NaplesWHNP-BC 09/21/2017 12:42 PM

## 2017-09-22 LAB — COMPREHENSIVE METABOLIC PANEL
ALK PHOS: 86 IU/L (ref 39–117)
ALT: 16 IU/L (ref 0–32)
AST: 12 IU/L (ref 0–40)
Albumin/Globulin Ratio: 1.4 (ref 1.2–2.2)
Albumin: 3.3 g/dL — ABNORMAL LOW (ref 3.5–5.5)
BUN/Creatinine Ratio: 13 (ref 9–23)
BUN: 8 mg/dL (ref 6–20)
CHLORIDE: 104 mmol/L (ref 96–106)
CO2: 21 mmol/L (ref 20–29)
CREATININE: 0.62 mg/dL (ref 0.57–1.00)
Calcium: 8.7 mg/dL (ref 8.7–10.2)
GFR calc Af Amer: 140 mL/min/{1.73_m2} (ref >59)
GFR calc non Af Amer: 121 mL/min/{1.73_m2} (ref >59)
GLOBULIN, TOTAL: 2.4 g/dL (ref 1.5–4.5)
GLUCOSE: 97 mg/dL (ref 65–99)
Potassium: 3.8 mmol/L (ref 3.5–5.2)
SODIUM: 138 mmol/L (ref 134–144)
Total Protein: 5.7 g/dL — ABNORMAL LOW (ref 6.0–8.5)

## 2017-09-22 LAB — CBC
HEMATOCRIT: 32.9 % — AB (ref 34.0–46.6)
Hemoglobin: 10.6 g/dL — ABNORMAL LOW (ref 11.1–15.9)
MCH: 27.9 pg (ref 26.6–33.0)
MCHC: 32.2 g/dL (ref 31.5–35.7)
MCV: 87 fL (ref 79–97)
Platelets: 180 10*3/uL (ref 150–450)
RBC: 3.8 x10E6/uL (ref 3.77–5.28)
RDW: 14.5 % (ref 12.3–15.4)
WBC: 7 10*3/uL (ref 3.4–10.8)

## 2017-09-22 LAB — RPR: RPR Ser Ql: NONREACTIVE

## 2017-09-22 LAB — HIV ANTIBODY (ROUTINE TESTING W REFLEX): HIV SCREEN 4TH GENERATION: NONREACTIVE

## 2017-09-22 LAB — PROTEIN / CREATININE RATIO, URINE
CREATININE, UR: 114.4 mg/dL
PROTEIN UR: 12 mg/dL
Protein/Creat Ratio: 105 mg/g creat (ref 0–200)

## 2017-09-22 LAB — GLUCOSE TOLERANCE, 2 HOURS W/ 1HR
GLUCOSE, 1 HOUR: 126 mg/dL (ref 65–179)
GLUCOSE, 2 HOUR: 91 mg/dL (ref 65–152)
Glucose, Fasting: 88 mg/dL (ref 65–91)

## 2017-09-22 LAB — ANTIBODY SCREEN: Antibody Screen: NEGATIVE

## 2017-09-23 LAB — URINE CULTURE

## 2017-09-24 ENCOUNTER — Telehealth: Payer: Self-pay | Admitting: Obstetrics & Gynecology

## 2017-09-24 ENCOUNTER — Encounter: Payer: Medicaid Other | Admitting: Women's Health

## 2017-09-24 ENCOUNTER — Other Ambulatory Visit: Payer: Self-pay | Admitting: Women's Health

## 2017-09-24 MED ORDER — FERROUS SULFATE 325 (65 FE) MG PO TABS: 325.0000 mg | ORAL_TABLET | Freq: Two times a day (BID) | ORAL | 3 refills | Status: DC

## 2017-09-24 NOTE — Telephone Encounter (Signed)
LMVOM that she is anemic and needs to continue taking iron along with PNV and increase diet in iron rich foods such as green leafy veggies, red meat, nuts, beans.  Also informed she does need to be seen since she had elevated BP last week.  Advised to call our office to schedule an appointment.

## 2017-09-27 ENCOUNTER — Telehealth: Payer: Self-pay | Admitting: *Deleted

## 2017-09-27 NOTE — Telephone Encounter (Signed)
Lab results reviewed with patient.  Advised to take Fe 2 times daily along with PNV.  Informed patient she needed to be seen ASAP for BP check but patient stated she would have to call back to schedule.  Informed patient we needed to see her but patient stated again she would have to call back.

## 2017-09-29 LAB — INFORMASEQ(SM) PRENATAL TEST
Fetal Fraction (%):: 21.8
Fetal Number: 1
Gestational Age at Collection: 29.6 weeks
Weight: 192 [lb_av]

## 2017-10-02 ENCOUNTER — Telehealth: Payer: Self-pay | Admitting: *Deleted

## 2017-10-02 NOTE — Telephone Encounter (Signed)
Attempted to return call; voicemail full

## 2017-10-03 NOTE — Telephone Encounter (Signed)
Spoke with pt. Pt thinks she has a yeast infection. She is using Monistat and that seems to be helping. I advised to continue that and call with any further concerns or questions. Pt voiced understanding. JSY

## 2017-10-17 ENCOUNTER — Encounter: Payer: Medicaid Other | Admitting: Advanced Practice Midwife

## 2017-10-31 ENCOUNTER — Encounter: Payer: Self-pay | Admitting: Advanced Practice Midwife

## 2017-10-31 ENCOUNTER — Ambulatory Visit (INDEPENDENT_AMBULATORY_CARE_PROVIDER_SITE_OTHER): Payer: Medicaid Other | Admitting: Advanced Practice Midwife

## 2017-10-31 VITALS — BP 127/85 | HR 81 | Wt 199.0 lb

## 2017-10-31 DIAGNOSIS — Z331 Pregnant state, incidental: Secondary | ICD-10-CM

## 2017-10-31 DIAGNOSIS — Z1389 Encounter for screening for other disorder: Secondary | ICD-10-CM

## 2017-10-31 DIAGNOSIS — Z3A35 35 weeks gestation of pregnancy: Secondary | ICD-10-CM

## 2017-10-31 DIAGNOSIS — Z3483 Encounter for supervision of other normal pregnancy, third trimester: Secondary | ICD-10-CM

## 2017-10-31 LAB — POCT URINALYSIS DIPSTICK OB
Glucose, UA: NEGATIVE — AB
Ketones, UA: NEGATIVE
Leukocytes, UA: NEGATIVE
Nitrite, UA: NEGATIVE
RBC UA: NEGATIVE

## 2017-10-31 NOTE — Patient Instructions (Signed)

## 2017-10-31 NOTE — Progress Notes (Signed)
LOW-RISK PREGNANCY VISIT Patient name: Megan Suarez MRN [id number]  Date of birth: 1985-10-25 Chief Complaint:   low risk ob (pain top of stomach after drinking milk)  History of Present Illness:   Megan Suarez is a 32 y.o. G45P0010 female at [redacted]w[redacted]d with an Estimated Date of Delivery: 12/01/17 being seen today for ongoing management of a low-risk pregnancy.  Today she reports a few moments of pain in top of belly after drinking milk.  Watch dairy products. Contractions: Not present.  .  Movement: Present. denies leaking of fluid. Review of Systems:   Pertinent items are noted in HPI Denies abnormal vaginal discharge w/ itching/odor/irritation, headaches, visual changes, shortness of breath, chest pain, abdominal pain, severe nausea/vomiting, or problems with urination or bowel movements unless otherwise stated above.  Pertinent History Reviewed:  Medical & Surgical Hx:   Past Medical History:  Diagnosis Date  . Dysmenorrhea 10/15/2014  . Dyspareunia 10/15/2014  . Menorrhagia with regular cycle 10/15/2014  . Pelvic pain in female 10/15/2014  . Pulmonary embolus (HCC)   . Vaginal Pap smear, abnormal    Past Surgical History:  Procedure Laterality Date  . NO PAST SURGERIES     Family History  Adopted: Yes  Problem Relation Age of Onset  . Diabetes Mother     Current Outpatient Medications:  .  enoxaparin (LOVENOX) 40 MG/0.4ML injection, Inject 0.8 mLs (80 mg total) into the skin daily., Disp: 30 Syringe, Rfl: 6 .  ferrous sulfate 325 (65 FE) MG tablet, Take 1 tablet (325 mg total) by mouth 2 (two) times daily with a meal., Disp: 60 tablet, Rfl: 3 .  Prenatal MV-Min-Fe Fum-FA-DHA (PRENATAL 1 PO), Take by mouth., Disp: , Rfl:  .  nitrofurantoin, macrocrystal-monohydrate, (MACROBID) 100 MG capsule, Take 1 capsule (100 mg total) by mouth 2 (two) times daily. X 7 days (Patient not taking: Reported on 09/21/2017), Disp: 14 capsule, Rfl: 0 Social History: Reviewed -  reports that she has  quit smoking. Her smoking use included cigarettes. She has never used smokeless tobacco.  Physical Assessment:   Vitals:   10/31/17 0846  BP: 127/85  Pulse: 81  Weight: 199 lb (90.3 kg)  Body mass index is 32.12 kg/m.        Physical Examination:   General appearance: Well appearing, and in no distress  Mental status: Alert, oriented to person, place, and time  Skin: Warm & dry  Cardiovascular: Normal heart rate noted  Respiratory: Normal respiratory effort, no distress  Abdomen: Soft, gravid, nontender  Pelvic: Cervical exam deferred         Extremities: Edema: Trace  Fetal Status:     Movement: Present    Results for orders placed or performed in visit on 10/31/17 (from the past 24 hour(s))  POC Urinalysis Dipstick OB   Collection Time: 10/31/17  8:53 AM  Result Value Ref Range   Color, UA     Clarity, UA     Glucose, UA Negative (A) (none)   Bilirubin, UA     Ketones, UA neg    Spec Grav, UA  1.010 - 1.025   Blood, UA neg    pH, UA  5.0 - 8.0   POC Protein UA Trace Negative, Trace   Urobilinogen, UA  0.2 or 1.0 E.U./dL   Nitrite, UA neg    Leukocytes, UA Negative Negative   Appearance     Odor      Assessment & Plan:  1) Low-risk pregnancy G2P0010 at  2518w4d with an Estimated Date of Delivery: 12/01/17   2) , elevated BP @ 30 weeks, normal today   Labs/procedures/US today:   Plan:  Continue routine obstetrical care    Follow-up: Return in about 1 week (around 11/07/2017) for LROB.  Orders Placed This Encounter  Procedures  . POC Urinalysis Dipstick OB   Jacklyn ShellFrances Cresenzo-Dishmon [facility] HospitalCNM 10/31/2017 9:19 AM

## 2017-11-07 ENCOUNTER — Encounter: Payer: Medicaid Other | Admitting: Advanced Practice Midwife

## 2017-11-14 ENCOUNTER — Ambulatory Visit (INDEPENDENT_AMBULATORY_CARE_PROVIDER_SITE_OTHER): Payer: Medicaid Other | Admitting: Advanced Practice Midwife

## 2017-11-14 ENCOUNTER — Other Ambulatory Visit: Payer: Self-pay

## 2017-11-14 ENCOUNTER — Encounter: Payer: Self-pay | Admitting: Advanced Practice Midwife

## 2017-11-14 VITALS — BP 128/86 | HR 76 | Wt 200.0 lb

## 2017-11-14 DIAGNOSIS — O26843 Uterine size-date discrepancy, third trimester: Secondary | ICD-10-CM

## 2017-11-14 DIAGNOSIS — Z3A37 37 weeks gestation of pregnancy: Secondary | ICD-10-CM

## 2017-11-14 DIAGNOSIS — Z3483 Encounter for supervision of other normal pregnancy, third trimester: Secondary | ICD-10-CM

## 2017-11-14 DIAGNOSIS — Z1389 Encounter for screening for other disorder: Secondary | ICD-10-CM

## 2017-11-14 DIAGNOSIS — Z331 Pregnant state, incidental: Secondary | ICD-10-CM

## 2017-11-14 LAB — POCT URINALYSIS DIPSTICK OB
Blood, UA: NEGATIVE
GLUCOSE, UA: NEGATIVE — AB
Ketones, UA: NEGATIVE
LEUKOCYTES UA: NEGATIVE
Nitrite, UA: NEGATIVE
POC,PROTEIN,UA: NEGATIVE

## 2017-11-14 LAB — OB RESULTS CONSOLE GBS: GBS: POSITIVE

## 2017-11-14 NOTE — Progress Notes (Signed)
  G2P0010 730w4d Estimated Date of Delivery: 12/01/17  Blood pressure 128/86, pulse 76, weight 200 lb (90.7 kg), last menstrual period 02/24/2017.   BP weight and urine results all reviewed and noted.  Please refer to the obstetrical flow sheet for the fundal height and fetal heart rate documentation:  Size<dates  Patient reports good fetal movement, denies any bleeding and no rupture of membranes symptoms or regular contractions. Patient is without complaints. All questions were answered.   Physical Assessment:   Vitals:   11/14/17 0855  BP: 128/86  Pulse: 76  Weight: 200 lb (90.7 kg)  Body mass index is 32.28 kg/m.        Physical Examination:   General appearance: Well appearing, and in no distress  Mental status: Alert, oriented to person, place, and time  Skin: Warm & dry  Cardiovascular: Normal heart rate noted  Respiratory: Normal respiratory effort, no distress  Abdomen: Soft, gravid, nontender  Pelvic: Cervical exam performed  Dilation: Closed Effacement (%): 70 Station: -2  Extremities: Edema: None  Fetal Status:     Movement: Present    No results found for this or any previous visit (from the past 24 hour(s)).   Orders Placed This Encounter  Procedures  . GC/Chlamydia Probe Amp  . Culture, beta strep (group b only)  . US OB Follow Up  . POC Urinalysis Dipstick OB    Plan:  Continued routine obstetrical care,   Return in about 1 week (around 11/21/2017) for LROB, asap for EFW/AFI.

## 2017-11-14 NOTE — Patient Instructions (Addendum)
AM I IN LABOR? What is labor? Labor is the work that your body does to birth your baby. Your uterus (the womb) contracts. Your cervix (the mouth of the uterus) opens. You will push your baby out into the world.  What do contractions (labor pains) feel like? When they first start, contractions usually feel like cramps during your period. Sometimes you feel pain in your back. Most often, contractions feel like muscles pulling painfully in your lower belly. At first, the contractions will probably be 15 to 20 minutes apart. They will not feel too painful. As labor goes on, the contractions get stronger, closer together, and more painful.  How do I time the contractions? Time your contractions by counting the number of minutes from the start of one contraction to the start of the next contraction.  What should I do when the contractions start? If it is night and you can sleep, sleep. If it happens during the day, here are some things you can do to take care of yourself at home: ? Walk. If the pains you are having are real labor, walking will make the contractions come faster and harder. If the contractions are not going to continue and be real labor, walking will make the contractions slow down. ? Take a shower or bath. This will help you relax. ? Eat. Labor is a big event. It takes a lot of energy. ? Drink water. Not drinking enough water can cause false labor (contractions that hurt but do not open your cervix). If this is true labor, drinking water will help you have strength to get through your labor. ? Take a nap. Get all the rest you can. ? Get a massage. If your labor is in your back, a strong massage on your lower back may feel very good. Getting a foot massage is always good. ? Don't panic. You can do this. Your body was made for this. You are strong!  When should I go to the hospital or call my health care provider? ? Your contractions have been 5 minutes apart or less for at least 1  hour. ? If several contractions are so painful you cannot walk or talk during one. ? Your bag of waters breaks. (You may have a big gush of water or just water that runs down your legs when you walk.)  Are there other reasons to call my health care provider? Yes, you should call your health care provider or go to the hospital if you start to bleed like you are having a period- blood that soaks your underwear or runs down your legs, if you have sudden severe pain, if your baby has not moved for several hours, or if you are leaking green fluid. The rule is as follows: If you are very concerned about something, call   BENEFITS OF BREASTFEEDING Many women wonder if they should breastfeed. Research shows that breast milk contains the perfect balance of vitamins, protein and fat that your baby needs to grow. It also contains antibodies that help your baby's immune system to fight off viruses and bacteria and can reduce the risk of sudden infant death syndrome (SIDS). In addition, the colostrum (a fluid secreted from the breast in the first few days after delivery) helps your newborn's digestive system to grow and function well. Breast milk is easier to digest than formula. Also, if your baby is born preterm, breast milk can help to reduce both short- and long-term health problems. BENEFITS OF BREASTFEEDING FOR MOM .  Breastfeeding causes a hormone to be released that helps the uterus to contract and return to its normal size more quickly. . It aids in postpartum weight loss, reduces risk of breast and ovarian cancer, heart disease and rheumatoid arthritis. . It decreases the amount of bleeding after the baby is born. benefits of breastfeeding for baby . Provides comfort and nutrition . Protects baby against - Obesity - Diabetes - Asthma - Childhood cancers - Heart disease - Ear infections - Diarrhea - Pneumonia - Stomach problems - Serious allergies - Skin rashes . Promotes growth and  development . Reduces the risk of baby having Sudden Infant Death Syndrome (SIDS) only breastmilk for the first 6 months . Protects baby against diseases/allergies . It's the perfect amount for tiny bellies . It restores baby's energy . Provides the best nutrition for baby . Giving water or formula can make baby more likely to get sick, decrease Mom's milk supply, make baby less content with breastfeeding Skin to Skin After delivery, the staff will place your baby on your chest. This helps with the following: . Regulates baby's temperature, breathing, heart rate and blood sugar . Increases Mom's milk supply . Promotes bonding . Keeps baby and Mom calm and decreases baby's crying Rooming In Your baby will stay in your room with you for the entire time you are in the hospital. This helps with the following: . Allows Mom to learn baby's feeding cues - Fluttering eyes - Sucking on tongue or hand - Rooting (opens mouth and turns head) - Nuzzling into the breast - Bringing hand to mouth . Allows breastfeeding on demand (when your baby is ready) . Helps baby to be calm and content . Ensures a good milk supply . Prevents complications with breastfeeding . Allows parents to learn to care for baby . Allows you to request assistance with breastfeeding Importance of a good latch . Increases milk transfer to baby - baby gets enough milk . Ensures you have enough milk for your baby . Decreases nipple soreness . Don't use pacifiers and bottles - these cause baby to suck differently than breastfeeding . Promotes continuation of breastfeeding Risks of Formula Supplementation with Breastfeeding Giving your infant formula in addition to your breast-milk EXCEPT when medically necessary can lead to: Marland Kitchen. Decreases your milk supply  . Loss of confidence in yourself for providing baby's nutrition  . Engorgement and possibly mastitis  . Asthma & allergies in the baby BREASTFEEDING FAQS How long should  I breastfeed my baby? It is recommended that you provide your baby with breast milk only for the first 6 months and then continue for the first year and longer as desired. During the first few weeks after birth, your baby will need to feed 8-12 times every 24 hours, or every 2-3 hours. They will likely feed for 15-30 minutes. How can I help my baby begin breastfeeding? Babies are born with an instinct to breastfeed. A healthy baby can begin breastfeeding right away without specific help. At the hospital, a nurse (or lactation consultant) will help you begin the process and will give you tips on good positioning. It may be helpful to take a breastfeeding class before you deliver in order to know what to expect. How can I help my baby latch on? In order to assist your baby in latching-on, cup your breast in your hand and stroke your baby's lower lip with your nipple to stimulate your baby's rooting reflex. Your baby will look like he or she  is yawning, at which point you should bring the baby towards your breast, while aiming the nipple at the roof of his or her mouth. Remember to bring the baby towards you and not your breast towards the baby. How can I tell if my baby is latched-on? Your baby will have all of your nipple and part of the dark area around the nipple in his or her mouth and your baby's nose will be touching your breast. You should see or hear the baby swallowing. If the baby is not latched-on properly, start the process over. To remove the suction, insert a clean finger between your breast and the baby's mouth. Should I switch breasts during feeding? After feeding on one side, switch the baby to your other breast. If he or she does not continue feeding - that is OK. Your baby will not necessarily need to feed from both breasts in a single feeding. On the next feeding, start with the other breast for efficiency and comfort. How can I tell if my baby is hungry? When your baby is hungry, they  will nuzzle against your breast, make sucking noises and tongue motions and may put their hands near their mouth. Crying is a late sign of hunger, so you should not wait until this point. When they have received enough milk, they will unlatch from the breast. Is it okay to use a pacifier? Until your baby gets the hang of breastfeeding, experts recommend limiting pacifier usage. If you have questions about this, please contact your pediatrician. What can I do to ensure proper nutrition while breastfeeding? . Make sure that you support your own health and your baby's by eating a healthy, well-balanced diet . Your provider may recommend that you continue to take your prenatal vitamin . Drink plenty of fluids. It is a good rule to drink one glass of water before or after feeding . Alcohol will remain in the breast milk for as long as it will remain in the blood stream. If you choose to have a drink, it is recommended that you wait at least 2 hours before feeding . Moderate amounts of caffeine are OK . Some over-the-counter or prescription medications are not recommended during breastfeeding. Check with your provider if you have questions What types of birth control methods are safe while breastfeeding? Progestin-only methods, including a daily pill, an IUD, the implant and the injection are safe while breastfeeding. Methods that contain estrogen (such as combination birth control pills, the vaginal ring and the patch) should not be used during the first month of breastfeeding as these can decrease your milk supply. Marland Kitchen

## 2017-11-15 ENCOUNTER — Encounter: Payer: Self-pay | Admitting: Advanced Practice Midwife

## 2017-11-15 ENCOUNTER — Ambulatory Visit (INDEPENDENT_AMBULATORY_CARE_PROVIDER_SITE_OTHER): Payer: Medicaid Other

## 2017-11-15 ENCOUNTER — Other Ambulatory Visit: Payer: Self-pay | Admitting: Advanced Practice Midwife

## 2017-11-15 DIAGNOSIS — O26843 Uterine size-date discrepancy, third trimester: Secondary | ICD-10-CM

## 2017-11-15 DIAGNOSIS — Z3403 Encounter for supervision of normal first pregnancy, third trimester: Secondary | ICD-10-CM

## 2017-11-15 DIAGNOSIS — O365990 Maternal care for other known or suspected poor fetal growth, unspecified trimester, not applicable or unspecified: Secondary | ICD-10-CM | POA: Insufficient documentation

## 2017-11-15 DIAGNOSIS — O0932 Supervision of pregnancy with insufficient antenatal care, second trimester: Secondary | ICD-10-CM

## 2017-11-15 NOTE — Progress Notes (Signed)
US 37+5 wks,cephalic,posterior pl gr 3,bilat adnexa's wnl,afi 12 cm,fhr 129 bpm,BPP 8/8,EFW 2576 g 7.7 %,RI .69,.68,.72,.75=97%,elevated UAD w/diastolic flow,discussed results w/Fran

## 2017-11-16 LAB — GC/CHLAMYDIA PROBE AMP
Chlamydia trachomatis, NAA: NEGATIVE
Neisseria gonorrhoeae by PCR: NEGATIVE

## 2017-11-17 LAB — CULTURE, BETA STREP (GROUP B ONLY): STREP GP B CULTURE: POSITIVE — AB

## 2017-11-19 ENCOUNTER — Other Ambulatory Visit: Payer: Self-pay

## 2017-11-19 ENCOUNTER — Ambulatory Visit (INDEPENDENT_AMBULATORY_CARE_PROVIDER_SITE_OTHER): Payer: Medicaid Other | Admitting: Obstetrics & Gynecology

## 2017-11-19 ENCOUNTER — Encounter: Payer: Self-pay | Admitting: Obstetrics & Gynecology

## 2017-11-19 ENCOUNTER — Encounter (HOSPITAL_COMMUNITY): Payer: Self-pay | Admitting: General Practice

## 2017-11-19 ENCOUNTER — Inpatient Hospital Stay (HOSPITAL_COMMUNITY)
Admission: AD | Admit: 2017-11-19 | Discharge: 2017-11-24 | DRG: 805 | Disposition: A | Discharge status: Home / Self Care | Payer: Medicaid Other | Attending: Obstetrics & Gynecology | Admitting: Obstetrics & Gynecology

## 2017-11-19 VITALS — BP 130/84 | HR 93 | Wt 205.0 lb

## 2017-11-19 DIAGNOSIS — I2699 Other pulmonary embolism without acute cor pulmonale: Secondary | ICD-10-CM | POA: Diagnosis present

## 2017-11-19 DIAGNOSIS — Z7901 Long term (current) use of anticoagulants: Secondary | ICD-10-CM | POA: Diagnosis not present

## 2017-11-19 DIAGNOSIS — F1290 Cannabis use, unspecified, uncomplicated: Secondary | ICD-10-CM | POA: Diagnosis present

## 2017-11-19 DIAGNOSIS — O0932 Supervision of pregnancy with insufficient antenatal care, second trimester: Secondary | ICD-10-CM

## 2017-11-19 DIAGNOSIS — O99824 Streptococcus B carrier state complicating childbirth: Secondary | ICD-10-CM | POA: Diagnosis present

## 2017-11-19 DIAGNOSIS — O99324 Drug use complicating childbirth: Secondary | ICD-10-CM | POA: Diagnosis present

## 2017-11-19 DIAGNOSIS — Z331 Pregnant state, incidental: Secondary | ICD-10-CM

## 2017-11-19 DIAGNOSIS — Z3A38 38 weeks gestation of pregnancy: Secondary | ICD-10-CM | POA: Diagnosis not present

## 2017-11-19 DIAGNOSIS — O0993 Supervision of high risk pregnancy, unspecified, third trimester: Secondary | ICD-10-CM

## 2017-11-19 DIAGNOSIS — O411230 Chorioamnionitis, third trimester, not applicable or unspecified: Secondary | ICD-10-CM | POA: Diagnosis present

## 2017-11-19 DIAGNOSIS — O365930 Maternal care for other known or suspected poor fetal growth, third trimester, not applicable or unspecified: Principal | ICD-10-CM | POA: Diagnosis present

## 2017-11-19 DIAGNOSIS — T7411XA Adult physical abuse, confirmed, initial encounter: Secondary | ICD-10-CM | POA: Diagnosis present

## 2017-11-19 DIAGNOSIS — O365990 Maternal care for other known or suspected poor fetal growth, unspecified trimester, not applicable or unspecified: Secondary | ICD-10-CM

## 2017-11-19 DIAGNOSIS — Z87891 Personal history of nicotine dependence: Secondary | ICD-10-CM

## 2017-11-19 DIAGNOSIS — Z1389 Encounter for screening for other disorder: Secondary | ICD-10-CM

## 2017-11-19 DIAGNOSIS — Z86711 Personal history of pulmonary embolism: Secondary | ICD-10-CM | POA: Diagnosis present

## 2017-11-19 DIAGNOSIS — O0990 Supervision of high risk pregnancy, unspecified, unspecified trimester: Secondary | ICD-10-CM

## 2017-11-19 HISTORY — DX: Bipolar disorder, unspecified: F31.9

## 2017-11-19 HISTORY — DX: Mental disorder, not otherwise specified: F99

## 2017-11-19 HISTORY — DX: Thromboembolism in pregnancy, unspecified trimester: O88.219

## 2017-11-19 LAB — TYPE AND SCREEN
ABO/RH(D): A POS
Antibody Screen: NEGATIVE

## 2017-11-19 LAB — CBC
HCT: 36.2 % (ref 36.0–46.0)
HEMOGLOBIN: 12 g/dL (ref 12.0–15.0)
MCH: 28.4 pg (ref 26.0–34.0)
MCHC: 33.1 g/dL (ref 30.0–36.0)
MCV: 85.8 fL (ref 78.0–100.0)
Platelets: 178 10*3/uL (ref 150–400)
RBC: 4.22 MIL/uL (ref 3.87–5.11)
RDW: 14.6 % (ref 11.5–15.5)
WBC: 8 10*3/uL (ref 4.0–10.5)

## 2017-11-19 LAB — ABO/RH: ABO/RH(D): A POS

## 2017-11-19 LAB — RAPID URINE DRUG SCREEN, HOSP PERFORMED
AMPHETAMINES: NOT DETECTED
BENZODIAZEPINES: NOT DETECTED
Barbiturates: NOT DETECTED
COCAINE: NOT DETECTED
OPIATES: NOT DETECTED
Tetrahydrocannabinol: POSITIVE — AB

## 2017-11-19 LAB — POCT URINALYSIS DIPSTICK OB
Glucose, UA: NEGATIVE — AB
KETONES UA: NEGATIVE
Leukocytes, UA: NEGATIVE
NITRITE UA: NEGATIVE
PROTEIN: NEGATIVE
RBC UA: NEGATIVE

## 2017-11-19 MED ORDER — LIDOCAINE HCL (PF) 1 % IJ SOLN
30.0000 mL | INTRAMUSCULAR | Status: DC | PRN
  Filled 2017-11-19: qty 30

## 2017-11-19 MED ORDER — LACTATED RINGERS IV SOLN
INTRAVENOUS | Status: DC
  Administered 2017-11-19 – 2017-11-21 (×8): via INTRAVENOUS

## 2017-11-19 MED ORDER — LACTATED RINGERS IV SOLN
500.0000 mL | INTRAVENOUS | Status: DC | PRN
  Administered 2017-11-21 (×2): 500 mL via INTRAVENOUS

## 2017-11-19 MED ORDER — ONDANSETRON HCL 4 MG/2ML IJ SOLN
4.0000 mg | Freq: Four times a day (QID) | INTRAMUSCULAR | Status: DC | PRN
  Administered 2017-11-20 – 2017-11-21 (×2): 4 mg via INTRAVENOUS
  Filled 2017-11-19 (×2): qty 2

## 2017-11-19 MED ORDER — OXYCODONE-ACETAMINOPHEN 5-325 MG PO TABS: 2.0000 | ORAL_TABLET | ORAL | Status: DC | PRN

## 2017-11-19 MED ORDER — ACETAMINOPHEN 325 MG PO TABS: 650.0000 mg | ORAL_TABLET | ORAL | Status: DC | PRN

## 2017-11-19 MED ORDER — SOD CITRATE-CITRIC ACID 500-334 MG/5ML PO SOLN
30.0000 mL | ORAL | Status: DC | PRN
  Administered 2017-11-20: 30 mL via ORAL
  Filled 2017-11-19: qty 15

## 2017-11-19 MED ORDER — ENOXAPARIN SODIUM 80 MG/0.8ML ~~LOC~~ SOLN
80.0000 mg | SUBCUTANEOUS | Status: DC
  Administered 2017-11-19: 80 mg via SUBCUTANEOUS
  Filled 2017-11-19: qty 0.8

## 2017-11-19 MED ORDER — PENICILLIN G 3 MILLION UNITS IVPB - SIMPLE MED
3.0000 10*6.[IU] | INTRAVENOUS | Status: DC
  Administered 2017-11-20 – 2017-11-21 (×7): 3 10*6.[IU] via INTRAVENOUS
  Filled 2017-11-19: qty 100
  Filled 2017-11-19 (×2): qty 3
  Filled 2017-11-19: qty 100
  Filled 2017-11-19: qty 3
  Filled 2017-11-19 (×3): qty 100
  Filled 2017-11-19 (×3): qty 3
  Filled 2017-11-19: qty 100
  Filled 2017-11-19: qty 3

## 2017-11-19 MED ORDER — OXYTOCIN BOLUS FROM INFUSION
500.0000 mL | Freq: Once | INTRAVENOUS | Status: AC
  Administered 2017-11-22: 500 mL via INTRAVENOUS

## 2017-11-19 MED ORDER — OXYTOCIN 40 UNITS IN LACTATED RINGERS INFUSION - SIMPLE MED: 2.5000 [IU]/h | INTRAVENOUS | Status: DC

## 2017-11-19 MED ORDER — PENICILLIN G POTASSIUM 5000000 UNITS IJ SOLR
5.0000 10*6.[IU] | Freq: Once | INTRAMUSCULAR | Status: AC
  Administered 2017-11-20: 5 10*6.[IU] via INTRAVENOUS
  Filled 2017-11-19: qty 5

## 2017-11-19 MED ORDER — OXYCODONE-ACETAMINOPHEN 5-325 MG PO TABS: 1.0000 | ORAL_TABLET | ORAL | Status: DC | PRN

## 2017-11-19 MED ORDER — FENTANYL CITRATE (PF) 100 MCG/2ML IJ SOLN
100.0000 ug | INTRAMUSCULAR | Status: DC | PRN
  Administered 2017-11-20 – 2017-11-21 (×5): 100 ug via INTRAVENOUS
  Filled 2017-11-19 (×5): qty 2

## 2017-11-19 MED ORDER — MISOPROSTOL 50MCG HALF TABLET
50.0000 ug | ORAL_TABLET | ORAL | Status: DC
  Administered 2017-11-19 – 2017-11-20 (×3): 50 ug via ORAL
  Filled 2017-11-19 (×5): qty 1
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
## 2017-11-19 NOTE — Progress Notes (Signed)
Megan Suarez is a 32 y.o. G2P0010 at 7071w2d admitted for IOL 2/2 IUGR.  Subjective: Doing well. Not in pain  Objective: BP 132/79   Pulse 80   Temp 98.5 F (36.9 C) (Oral)   Resp 16   Ht 5\' 5"  (1.651 m)   Wt 93 kg   LMP 02/24/2017 Comment: neg preg test 02/14/17  BMI 34.12 kg/m  No intake/output data recorded. No intake/output data recorded.  FHT:  FHR: 145 bpm, variability: moderate,  accelerations:  Present,  decelerations:  Absent UC:   none SVE:   Dilation: Closed Effacement (%): Thick Station: Ballotable Exam by:: Dr. Earlene PlaterWallace  Labs: Lab Results  Component Value Date   WBC 8.0 11/19/2017   HGB 12.0 11/19/2017   HCT 36.2 11/19/2017   MCV 85.8 11/19/2017   PLT 178 11/19/2017    Assessment / Plan: Induction of labor due to IUGR,  Given cytotec x1  Labor: Progressing on Pitocin, will c/w cytotec Preeclampsia:  no signs or symptoms of toxicity Fetal Wellbeing:  Category I ROM: intact Pain Control:  Labor support without medications and IV pain meds I/D:  GBS pos, pcn will be on board in active labor Anticipated MOD:  NSVD   Megan SchatzPatricia Zayed Griffie, DO Family Medicine, PGY-3

## 2017-11-19 NOTE — Anesthesia Pain Management Evaluation Note (Signed)
  [facility]NA Pain Management Visit Note  Patient: [name], 32 y.o., female  "[facility]. We have an anesthesia team available at all times to provide care throughout the hospital, including epidural management and anesthesia for C-section. I don't know your plan for the delivery whether it a natural birth, water birth, IV sedation, nitrous supplementation, doula or epidural, but we want to meet your pain goals."   1.Was your pain managed to your expectations on prior hospitalizations?   No prior hospitalizations  2.What is your expectation for pain management during this hospitalization?     Epidural, IV pain meds and Nitrous Oxide  3.How can we help you reach that goal? Be available  Record the patient's initial score and the patient's pain goal.   Pain: 0  Pain Goal: 5 [facility] wants you to be able to say your pain was always managed very well.  [facility] CenterMERRITT,Novella Abraha 11/19/2017

## 2017-11-19 NOTE — Progress Notes (Signed)
   HIGH-RISK PREGNANCY VISIT Patient name: Wilfred LacyJalisa R Veley MRN [id number]  Date of birth: May 10, 1985 Chief Complaint:   High Risk Gestation (NST)  History of Present Illness:   Wilfred LacyJalisa R Hogle is a 32 y.o. 82P0010 female at 2555w2d with an Estimated Date of Delivery: 12/01/17 being seen today for ongoing management of a high-risk pregnancy complicated by FGR.  Today she reports no complaints. Contractions: Not present. Vag. Bleeding: None.  Movement: Present. denies leaking of fluid.  Review of Systems:   Pertinent items are noted in HPI Denies abnormal vaginal discharge w/ itching/odor/irritation, headaches, visual changes, shortness of breath, chest pain, abdominal pain, severe nausea/vomiting, or problems with urination or bowel movements unless otherwise stated above. Pertinent History Reviewed:  Reviewed past medical,surgical, social, obstetrical and family history.  Reviewed problem list, medications and allergies. Physical Assessment:   Vitals:   11/19/17 1359  BP: 130/84  Pulse: 93  Weight: 205 lb (93 kg)  Body mass index is 33.09 kg/m.           Physical Examination:   General appearance: alert, well appearing, and in no distress  Mental status: alert, oriented to person, place, and time  Skin: warm & dry   Extremities: Edema: Trace    Cardiovascular: normal heart rate noted  Respiratory: normal respiratory effort, no distress  Abdomen: gravid, soft, non-tender  Pelvic: Cervical exam deferred         Fetal Status:     Movement: Present    Fetal Surveillance Testing today: Reactive NST   Results for orders placed or performed in visit on 11/19/17 (from the past 24 hour(s))  POC Urinalysis Dipstick OB   Collection Time: 11/19/17  2:00 PM  Result Value Ref Range   Color, UA     Clarity, UA     Glucose, UA Negative (A) (none)   Bilirubin, UA     Ketones, UA neg    Spec Grav, UA     Blood, UA neg    pH, UA     POC Protein UA Negative Negative, Trace   Urobilinogen,  UA     Nitrite, UA neg    Leukocytes, UA Negative Negative   Appearance     Odor      Assessment & Plan:  1) High-risk pregnancy G2P0010 at 1155w2d with an Estimated Date of Delivery: 12/01/17   2) FGR, sonogram 11/15/2017 with elevated fetal Doppler ratios 97% unstable    Meds: No orders of the defined types were placed in this encounter.   Labs/procedures today: NST reactive  Treatment Plan:  Sonogram reviewed and with EFW @7 .7% and Dopplers >90% recommend proceeding with cervical ripening and delivery.  Discussed with Dr Nettie ElmMichael Ervin who agrees to proceed with ripening and delivery.  Testing is otherwise reassuring  Reviewed: Term labor symptoms and general obstetric precautions including but not limited to vaginal bleeding, contractions, leaking of fluid and fetal movement were reviewed in detail with the patient.  All questions were answered.  Follow-up: Return in about 6 weeks (around 12/31/2017) for post partum visit.  Orders Placed This Encounter  Procedures  . POC Urinalysis Dipstick OB   Lazaro ArmsLuther H Denys Salinger  11/19/2017 4:49 PM

## 2017-11-20 ENCOUNTER — Inpatient Hospital Stay (HOSPITAL_COMMUNITY): Payer: Medicaid Other | Admitting: Anesthesiology

## 2017-11-20 LAB — RPR: RPR Ser Ql: NONREACTIVE

## 2017-11-20 MED ORDER — PHENYLEPHRINE 40 MCG/ML (10ML) SYRINGE FOR IV PUSH (FOR BLOOD PRESSURE SUPPORT)
80.0000 ug | PREFILLED_SYRINGE | INTRAVENOUS | Status: DC | PRN
  Administered 2017-11-20: 80 ug via INTRAVENOUS
  Filled 2017-11-20: qty 5

## 2017-11-20 MED ORDER — PHENYLEPHRINE 40 MCG/ML (10ML) SYRINGE FOR IV PUSH (FOR BLOOD PRESSURE SUPPORT)
PREFILLED_SYRINGE | INTRAVENOUS | Status: AC
  Filled 2017-11-20: qty 10

## 2017-11-20 MED ORDER — FENTANYL 2.5 MCG/ML BUPIVACAINE 1/10 % EPIDURAL INFUSION (WH - ANES)
14.0000 mL/h | INTRAMUSCULAR | Status: DC | PRN
Start: 2017-11-20 — End: 2017-11-22
  Administered 2017-11-20 – 2017-11-21 (×3): 14 mL/h via EPIDURAL
  Administered 2017-11-21: 10 mL/h via EPIDURAL
  Administered 2017-11-21: 14 mL/h via EPIDURAL
  Filled 2017-11-20 (×5): qty 100

## 2017-11-20 MED ORDER — PHENYLEPHRINE 40 MCG/ML (10ML) SYRINGE FOR IV PUSH (FOR BLOOD PRESSURE SUPPORT)
80.0000 ug | PREFILLED_SYRINGE | INTRAVENOUS | Status: DC | PRN
  Filled 2017-11-20: qty 5

## 2017-11-20 MED ORDER — LACTATED RINGERS IV SOLN
500.0000 mL | Freq: Once | INTRAVENOUS | Status: AC
  Administered 2017-11-20: 500 mL via INTRAVENOUS

## 2017-11-20 MED ORDER — OXYTOCIN 40 UNITS IN LACTATED RINGERS INFUSION - SIMPLE MED
1.0000 m[IU]/min | INTRAVENOUS | Status: DC
  Administered 2017-11-20 – 2017-11-21 (×2): 2 m[IU]/min via INTRAVENOUS
  Filled 2017-11-20 (×2): qty 1000

## 2017-11-20 MED ORDER — TERBUTALINE SULFATE 1 MG/ML IJ SOLN
0.2500 mg | Freq: Once | INTRAMUSCULAR | Status: DC | PRN
  Filled 2017-11-20: qty 1

## 2017-11-20 MED ORDER — EPHEDRINE 5 MG/ML INJ
10.0000 mg | INTRAVENOUS | Status: DC | PRN
  Filled 2017-11-20: qty 2

## 2017-11-20 MED ORDER — LIDOCAINE HCL (PF) 1 % IJ SOLN
INTRAMUSCULAR | Status: DC | PRN
  Administered 2017-11-20: 13 mL via EPIDURAL
  Administered 2017-11-21 (×2): 8 mL via EPIDURAL

## 2017-11-20 MED ORDER — DIPHENHYDRAMINE HCL 50 MG/ML IJ SOLN: 12.5000 mg | INTRAMUSCULAR | Status: DC | PRN

## 2017-11-20 MED ORDER — FENTANYL 2.5 MCG/ML BUPIVACAINE 1/10 % EPIDURAL INFUSION (WH - ANES)
INTRAMUSCULAR | Status: AC
  Filled 2017-11-20: qty 100

## 2017-11-20 NOTE — Anesthesia Procedure Notes (Signed)
Epidural Patient location during procedure: OB Start time: 11/20/2017 4:29 AM End time: 11/20/2017 4:42 AM  Staffing Anesthesiologist: Lowella CurbMiller, Muslima Toppins Ray, MD Performed: anesthesiologist   Preanesthetic Checklist Completed: patient identified, site marked, surgical consent, pre-op evaluation, timeout performed, IV checked, risks and benefits discussed and monitors and equipment checked  Epidural Patient position: sitting Prep: ChloraPrep Patient monitoring: heart rate, cardiac monitor, continuous pulse ox and blood pressure Approach: midline Location: L2-L3 Injection technique: LOR saline  Needle:  Needle type: Tuohy  Needle gauge: 17 G Needle length: 9 cm Needle insertion depth: 6 cm Catheter type: closed end flexible Catheter size: 20 Guage Catheter at skin depth: 10 cm Test dose: negative  Assessment Events: blood not aspirated, injection not painful, no injection resistance, negative IV test and no paresthesia  Additional Notes Reason for block:procedure for pain

## 2017-11-20 NOTE — Progress Notes (Signed)
Patient ID: Megan Suarez, female   DOB: 06/03/1985, 32 y.o.   MRN: [id number]  Introduced self to patient.  She was resting comfortably in bed.  Did not have any complaints.  Spoke to her about current plan and how we will gradually increase her pitocin until contractions are adequate.    S/p FB and cytotec. Currently on pitocin. Has epidural.   FHT: 140bpm. Moderate variability. Present accels.  No decels.    BP 114/64   Pulse 77   Temp 99.5 F (37.5 C) (Oral)   Resp 18   Ht 5\' 5"  (1.651 m)   Wt 93 kg   LMP 02/24/2017 Comment: neg preg test 02/14/17  SpO2 97%   BMI 34.12 kg/m   Dilation: 4 Effacement (%): 70 Cervical Position: Posterior Station: -2 Presentation: Vertex Exam by:: stone rnc

## 2017-11-20 NOTE — Progress Notes (Addendum)
Patient ID: [name], female   DOB: 1986-02-16, 32 y.o.   MRN: [id number]  Patient is feeling nauseous. Otherwise doing well.  FB is out,, on pitocin and cervix gradually progressing .  Currently 5.5/70%.  Membranes intact.  THC positive on UDS, but otherwise negative. GBS pos on PCN.   FHT: 155bpm. Mod. Variability.  Present accels.  No decels.    BP (!) 124/92   Pulse 93   Temp 99.4 F (37.4 C) (Oral)   Resp 17   Ht 5\' 5"  (1.651 m)   Wt 93 kg   LMP 02/24/2017 Comment: neg preg test 02/14/17  SpO2 97%   BMI 34.12 kg/m   Dilation: 5.5 Effacement (%): 70 Cervical Position: Posterior Station: -2 Presentation: Vertex Exam by:: stone rnc

## 2017-11-20 NOTE — Anesthesia Preprocedure Evaluation (Signed)
Anesthesia Evaluation  Patient identified by MRN, date of birth, ID band Patient awake    Reviewed: Allergy & Precautions, NPO status , Patient's Chart, lab work & pertinent test results  Airway Mallampati: II  TM Distance: >3 FB Neck ROM: Full    Dental no notable dental hx.    Pulmonary neg pulmonary ROS, former smoker,    Pulmonary exam normal breath sounds clear to auscultation       Cardiovascular negative cardio ROS Normal cardiovascular exam Rhythm:Regular Rate:Normal     Neuro/Psych negative neurological ROS  negative psych ROS   GI/Hepatic negative GI ROS, Neg liver ROS,   Endo/Other  negative endocrine ROS  Renal/GU negative Renal ROS  negative genitourinary   Musculoskeletal negative musculoskeletal ROS (+)   Abdominal   Peds negative pediatric ROS (+)  Hematology negative hematology ROS (+)   Anesthesia Other Findings   Reproductive/Obstetrics negative OB ROS (+) Pregnancy                             Anesthesia Physical Anesthesia Plan  ASA: II  Anesthesia Plan: Epidural   Post-op Pain Management:    Induction:   PONV Risk Score and Plan:   Airway Management Planned:   Additional Equipment:   Intra-op Plan:   Post-operative Plan:   Informed Consent:   Plan Discussed with:   Anesthesia Plan Comments:         Anesthesia Quick Evaluation  

## 2017-11-20 NOTE — Progress Notes (Signed)
Labor Progress Note Megan LacyJalisa R Suarez is a 32 y.o. G2P0010 at 695w3d presented for IOL for FGR  S:  Getting more uncomfortable with ctx  O:  BP 136/83   Pulse 78   Temp 98.6 F (37 C) (Oral)   Resp 17   Ht 5\' 5"  (1.651 m)   Wt 93 kg   LMP 02/24/2017 Comment: neg preg test 02/14/17  BMI 34.12 kg/m  EFM: baseline 130 bpm/ mod variability/ + accels/ no decels  Toco: 1-3 SVE: 1/50/-3, vtx   A/P: 31 y.o. G2P0010 575w3d  1. Labor: latent 2. FWB: Cat I 3. Pain: analgesia prn  Recommend FB, pt agrees. FB placed, tolerated well. Pitocin once FB out. Anticipate SVD.  Megan LarryMelanie Sharion Suarez, CNM 3:18 AM
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
## 2017-11-21 ENCOUNTER — Encounter: Payer: Medicaid Other | Admitting: Advanced Practice Midwife

## 2017-11-21 LAB — CBC
HCT: 30.6 % — ABNORMAL LOW (ref 36.0–46.0)
Hemoglobin: 10.5 g/dL — ABNORMAL LOW (ref 12.0–15.0)
MCH: 28.9 pg (ref 26.0–34.0)
MCHC: 34.3 g/dL (ref 30.0–36.0)
MCV: 84.3 fL (ref 78.0–100.0)
PLATELETS: 133 10*3/uL — AB (ref 150–400)
RBC: 3.63 MIL/uL — ABNORMAL LOW (ref 3.87–5.11)
RDW: 14.2 % (ref 11.5–15.5)
WBC: 16.5 10*3/uL — ABNORMAL HIGH (ref 4.0–10.5)

## 2017-11-21 MED ORDER — DIPHENHYDRAMINE HCL 50 MG/ML IJ SOLN: 12.5000 mg | Freq: Four times a day (QID) | INTRAMUSCULAR | Status: DC | PRN

## 2017-11-21 MED ORDER — ACETAMINOPHEN 500 MG PO TABS
1000.0000 mg | ORAL_TABLET | Freq: Four times a day (QID) | ORAL | Status: DC | PRN
  Administered 2017-11-21: 1000 mg via ORAL
  Filled 2017-11-21: qty 2

## 2017-11-21 MED ORDER — SODIUM CHLORIDE 0.9% FLUSH: 9.0000 mL | INTRAVENOUS | Status: DC | PRN

## 2017-11-21 MED ORDER — NALOXONE HCL 0.4 MG/ML IJ SOLN: 0.4000 mg | INTRAMUSCULAR | Status: DC | PRN

## 2017-11-21 MED ORDER — ONDANSETRON HCL 4 MG/2ML IJ SOLN: 4.0000 mg | Freq: Four times a day (QID) | INTRAMUSCULAR | Status: DC | PRN

## 2017-11-21 MED ORDER — GENTAMICIN SULFATE 40 MG/ML IJ SOLN
160.0000 mg | Freq: Three times a day (TID) | INTRAVENOUS | Status: DC
  Administered 2017-11-21: 160 mg via INTRAVENOUS
  Filled 2017-11-21 (×3): qty 4

## 2017-11-21 MED ORDER — SODIUM CHLORIDE 0.9 % IV SOLN
2.0000 g | Freq: Four times a day (QID) | INTRAVENOUS | Status: DC
  Administered 2017-11-21 – 2017-11-22 (×2): 2 g via INTRAVENOUS
  Filled 2017-11-21: qty 2000
  Filled 2017-11-21 (×2): qty 2
  Filled 2017-11-21: qty 2000

## 2017-11-21 MED ORDER — DIPHENHYDRAMINE HCL 12.5 MG/5ML PO ELIX
12.5000 mg | ORAL_SOLUTION | Freq: Four times a day (QID) | ORAL | Status: DC | PRN
  Filled 2017-11-21: qty 5

## 2017-11-21 MED ORDER — SODIUM BICARBONATE 8.4 % IV SOLN
INTRAVENOUS | Status: DC | PRN
  Administered 2017-11-21: 5 mL via EPIDURAL
  Administered 2017-11-21: 7 mL via EPIDURAL

## 2017-11-21 MED ORDER — FENTANYL 40 MCG/ML IV SOLN
INTRAVENOUS | Status: DC
  Administered 2017-11-21: 40 ug via INTRAVENOUS
  Filled 2017-11-21: qty 25

## 2017-11-21 MED ORDER — FENTANYL CITRATE (PF) 100 MCG/2ML IJ SOLN
INTRAMUSCULAR | Status: DC | PRN
  Administered 2017-11-21: 100 ug via INTRAVENOUS
  Administered 2017-11-21: 100 ug via EPIDURAL
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
## 2017-11-21 NOTE — Anesthesia Procedure Notes (Signed)
Epidural Patient location during procedure: OB Start time: 11/21/2017 11:05 PM End time: 11/21/2017 11:09 PM  Staffing Anesthesiologist: Leilani AbleHatchett, Gustavo Dispenza, MD Performed: anesthesiologist   Preanesthetic Checklist Completed: patient identified, site marked, surgical consent, pre-op evaluation, timeout performed, IV checked, risks and benefits discussed and monitors and equipment checked  Epidural Patient position: sitting Prep: site prepped and draped and DuraPrep Patient monitoring: continuous pulse ox and blood pressure Approach: midline Location: L3-L4 Injection technique: LOR air  Needle:  Needle type: Tuohy  Needle gauge: 17 G Needle length: 9 cm and 9 Needle insertion depth: 8 cm Catheter type: closed end flexible Catheter size: 19 Gauge Catheter at skin depth: 14 cm Test dose: negative and Other  Assessment Sensory level: T10 Events: blood not aspirated, injection not painful, no injection resistance, negative IV test and no paresthesia  Additional Notes Reason for block:procedure for pain
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
## 2017-11-21 NOTE — Progress Notes (Signed)
ANTIBIOTIC CONSULT NOTE - INITIAL  Pharmacy Consult for Gentamicin Indication: Chorioamnionitis  No Known Allergies  Patient Measurements: Height: 5\' 5"  (165.1 cm) Weight: 205 lb 0.4 oz (93 kg) IBW/kg (Calculated) : 57 kg Adjusted Body Weight: 68 kg  Vital Signs: Temp: 102.4 F (39.1 C) (08/14 1737) Temp Source: Axillary (08/14 1737) BP: 145/82 (08/14 1737) Pulse Rate: 85 (08/14 1737)  Labs: Recent Labs    11/19/17 1725  WBC 8.0  HGB 12.0  PLT 178    SCr on 09/21/17 0.62 - estimated CrCl ~ 119 ml/min.  Microbiology: Recent Results (from the past 720 hour(s))  OB RESULT CONSOLE Group B Strep     Status: None   Collection Time: 11/14/17 12:00 AM  Result Value Ref Range Status   GBS Positive  Final  GC/Chlamydia Probe Amp     Status: None   Collection Time: 11/14/17 11:45 AM  Result Value Ref Range Status   Chlamydia trachomatis, NAA Negative Negative Final   Neisseria gonorrhoeae by PCR Negative Negative Final  Culture, beta strep (group b only)     Status: Abnormal   Collection Time: 11/14/17 11:45 AM  Result Value Ref Range Status   Strep Gp B Culture Positive (A) Negative Final    Comment: Centers for Disease Control and Prevention (CDC) and American Congress of Obstetricians and Gynecologists (ACOG) guidelines for prevention of perinatal group B streptococcal (GBS) disease specify co-collection of a vaginal and rectal swab specimen to maximize sensitivity of GBS detection. Per the CDC and ACOG, swabbing both the lower vagina and rectum substantially increases the yield of detection compared with sampling the vagina alone. Penicillin G, ampicillin, or cefazolin are indicated for intrapartum prophylaxis of perinatal GBS colonization. Reflex susceptibility testing should be performed prior to use of clindamycin only on GBS isolates from penicillin-allergic women who are considered a high risk for anaphylaxis. Treatment with vancomycin without additional  testing is warranted if resistance to clindamycin is noted.     Medications:  Ampicillin 2 grams IV Q6hr  Assessment: 32 y.o. female G2P0010 at 5764w4d  Estimated Ke = 0.355, Vd = 0.35 L/kg  Goal of Therapy:  Gentamicin peak 6-8 mg/L and Trough < 1 mg/L  Plan:  Gentamicin 160 mg IV every 8 hrs  Check Scr with next labs if gentamicin continued. Will check gentamicin levels if continued > 72hr or clinically indicated.  Natasha BenceCline, Tashi Band 11/21/2017,5:52 PM

## 2017-11-21 NOTE — Anesthesia Postprocedure Evaluation (Signed)
Anesthesia Post Note  Patient: Megan Suarez  Procedure(s) Performed: AN AD HOC LABOR EPIDURAL     Patient location during evaluation: Mother Baby Anesthesia Type: Epidural Level of consciousness: awake and alert Pain management: pain level controlled Vital Signs Assessment: post-procedure vital signs reviewed and stable Respiratory status: spontaneous breathing, nonlabored ventilation and respiratory function stable Cardiovascular status: stable Postop Assessment: no headache, no backache and epidural receding Anesthetic complications: no    Last Vitals:  Vitals:   11/21/17 2002 11/21/17 2100  BP: (!) 117/59   Pulse: 91   Resp: 18   Temp:  (!) 38.6 C  SpO2:      Last Pain:  Vitals:   11/21/17 2100  TempSrc: Oral  PainSc:                  [name]

## 2017-11-21 NOTE — Progress Notes (Signed)
Patient ID: Megan Suarez, female   DOB: [birth year], 32 y.o.   MRN: [id number]  Patient has temperature > 101.  Went to patient's room and she is shivering, but says she feels okay.  ftal heart rate is 150bpm with accels, no decels. Will start amp and genta and d/c PCN (GBS).    FHT: 150 mod var. Reactive.  No decel.   BP 135/80   Pulse 82   Temp (!) 101 F (38.3 C) (Axillary)   Resp 17   Ht 5\' 5"  (1.651 m)   Wt 93 kg   LMP 02/24/2017 Comment: neg preg test 02/14/17  SpO2 98%   BMI 34.12 kg/m   Dilation: 6 Effacement (%): 60, 70 Cervical Position: Anterior Station: 0 Presentation: Vertex Exam by:: Megan DoppAmber Knox, RN

## 2017-11-21 NOTE — Progress Notes (Signed)
Called to room due to recurrent late variables. Patient states she is feeling more pressure but states epidural is adequate. Cervical exam unchanged at 8/90/0. Pit turned off for a break will restart in 30 minutes and titrate according. Late variables resolved with pit turned off. Cat 1 now with ctx q2-663min

## 2017-11-22 ENCOUNTER — Encounter (HOSPITAL_COMMUNITY): Payer: Self-pay

## 2017-11-22 ENCOUNTER — Encounter: Payer: Medicaid Other | Admitting: Advanced Practice Midwife

## 2017-11-22 ENCOUNTER — Other Ambulatory Visit: Payer: Self-pay

## 2017-11-22 ENCOUNTER — Other Ambulatory Visit: Payer: Medicaid Other

## 2017-11-22 ENCOUNTER — Telehealth: Payer: Self-pay | Admitting: *Deleted

## 2017-11-22 DIAGNOSIS — O99824 Streptococcus B carrier state complicating childbirth: Secondary | ICD-10-CM

## 2017-11-22 DIAGNOSIS — Z3A38 38 weeks gestation of pregnancy: Secondary | ICD-10-CM

## 2017-11-22 DIAGNOSIS — O365930 Maternal care for other known or suspected poor fetal growth, third trimester, not applicable or unspecified: Secondary | ICD-10-CM

## 2017-11-22 MED ORDER — ENOXAPARIN SODIUM 40 MG/0.4ML ~~LOC~~ SOLN
40.0000 mg | SUBCUTANEOUS | Status: DC
  Administered 2017-11-22 – 2017-11-23 (×2): 40 mg via SUBCUTANEOUS
  Filled 2017-11-22 (×2): qty 0.4

## 2017-11-22 MED ORDER — MEASLES, MUMPS & RUBELLA VAC ~~LOC~~ INJ
0.5000 mL | INJECTION | Freq: Once | SUBCUTANEOUS | Status: DC
  Filled 2017-11-22: qty 0.5

## 2017-11-22 MED ORDER — ACETAMINOPHEN 325 MG PO TABS: 650.0000 mg | ORAL_TABLET | ORAL | Status: DC | PRN

## 2017-11-22 MED ORDER — DIPHENHYDRAMINE HCL 25 MG PO CAPS: 25.0000 mg | ORAL_CAPSULE | Freq: Four times a day (QID) | ORAL | Status: DC | PRN

## 2017-11-22 MED ORDER — OXYCODONE HCL 5 MG PO TABS: 10.0000 mg | ORAL_TABLET | ORAL | Status: DC | PRN

## 2017-11-22 MED ORDER — PRENATAL MULTIVITAMIN CH
1.0000 | ORAL_TABLET | Freq: Every day | ORAL | Status: DC
  Administered 2017-11-22 – 2017-11-24 (×3): 1 via ORAL
  Filled 2017-11-22 (×3): qty 1

## 2017-11-22 MED ORDER — ENOXAPARIN SODIUM 40 MG/0.4ML ~~LOC~~ SOLN: 40.0000 mg | SUBCUTANEOUS | Status: DC

## 2017-11-22 MED ORDER — DIBUCAINE 1 % RE OINT: 1.0000 "application " | TOPICAL_OINTMENT | RECTAL | Status: DC | PRN

## 2017-11-22 MED ORDER — IBUPROFEN 600 MG PO TABS
600.0000 mg | ORAL_TABLET | Freq: Four times a day (QID) | ORAL | Status: DC
  Administered 2017-11-22 – 2017-11-24 (×9): 600 mg via ORAL
  Filled 2017-11-22 (×9): qty 1

## 2017-11-22 MED ORDER — SIMETHICONE 80 MG PO CHEW: 80.0000 mg | CHEWABLE_TABLET | ORAL | Status: DC | PRN

## 2017-11-22 MED ORDER — ONDANSETRON HCL 4 MG/2ML IJ SOLN: 4.0000 mg | INTRAMUSCULAR | Status: DC | PRN

## 2017-11-22 MED ORDER — ZOLPIDEM TARTRATE 5 MG PO TABS: 5.0000 mg | ORAL_TABLET | Freq: Every evening | ORAL | Status: DC | PRN

## 2017-11-22 MED ORDER — TETANUS-DIPHTH-ACELL PERTUSSIS 5-2.5-18.5 LF-MCG/0.5 IM SUSP: 0.5000 mL | Freq: Once | INTRAMUSCULAR | Status: DC

## 2017-11-22 MED ORDER — COCONUT OIL OIL
1.0000 "application " | TOPICAL_OIL | Status: DC | PRN
  Filled 2017-11-22 (×2): qty 120

## 2017-11-22 MED ORDER — ONDANSETRON HCL 4 MG PO TABS: 4.0000 mg | ORAL_TABLET | ORAL | Status: DC | PRN

## 2017-11-22 MED ORDER — BENZOCAINE-MENTHOL 20-0.5 % EX AERO: 1.0000 "application " | INHALATION_SPRAY | CUTANEOUS | Status: DC | PRN

## 2017-11-22 MED ORDER — WITCH HAZEL-GLYCERIN EX PADS: 1.0000 "application " | MEDICATED_PAD | CUTANEOUS | Status: DC | PRN

## 2017-11-22 MED ORDER — OXYCODONE HCL 5 MG PO TABS: 5.0000 mg | ORAL_TABLET | ORAL | Status: DC | PRN

## 2017-11-22 MED ORDER — SENNOSIDES-DOCUSATE SODIUM 8.6-50 MG PO TABS
2.0000 | ORAL_TABLET | ORAL | Status: DC
  Administered 2017-11-23 (×2): 2 via ORAL
  Filled 2017-11-22 (×2): qty 2

## 2017-11-22 MED ORDER — FERROUS SULFATE 325 (65 FE) MG PO TABS
325.0000 mg | ORAL_TABLET | Freq: Two times a day (BID) | ORAL | Status: DC
  Administered 2017-11-22 – 2017-11-24 (×5): 325 mg via ORAL
  Filled 2017-11-22 (×5): qty 1

## 2017-11-22 NOTE — Telephone Encounter (Signed)
LMOM for pt to call us back to schedule a pp appointment.  11-22-17  AS

## 2017-11-22 NOTE — Anesthesia Postprocedure Evaluation (Signed)
Anesthesia Post Note  Patient: Wilfred LacyJalisa R Hathaway  Procedure(s) Performed: AN AD HOC LABOR EPIDURAL     Patient location during evaluation: Mother Baby Anesthesia Type: Epidural Level of consciousness: awake Pain management: pain level controlled Vital Signs Assessment: post-procedure vital signs reviewed and stable Respiratory status: spontaneous breathing Cardiovascular status: stable Postop Assessment: epidural receding and patient able to bend at knees Anesthetic complications: no    Last Vitals:  Vitals:   11/22/17 0531 11/22/17 0715  BP: 122/81   Pulse: (!) 55   Resp: 18   Temp: 36.8 C 36.5 C  SpO2:      Last Pain:  Vitals:   11/22/17 0715  TempSrc: Oral  PainSc: 0-No pain   Pain Goal: Patients Stated Pain Goal: 7 (11/21/17 [id number])               Edison PaceWILKERSON,Braylin Formby

## 2017-11-22 NOTE — Progress Notes (Signed)
Pt educated to breastfeed NB, pump and supplement with additional breast milk. Pt also encourage to feed NB at least every 2-3 hours if he does not show any cues Pt verbalized understanding.

## 2017-11-22 NOTE — Lactation Note (Signed)
This note was copied from a baby's chart. Lactation Consultation Note  Patient Name: Megan Suarez ScrapeJalisa Buckwalter WUJWJ'XToday's Date: 11/22/2017  Per RN, mom declined LC services last night and  prefers to be seen by Lactation later today.    Maternal Data    Feeding Feeding Type: Formula Nipple Type: Slow - flow  LATCH Score                   Interventions    Lactation Tools Discussed/Used     Consult Status      Danelle EarthlyRobin Shwanda Soltis 11/22/2017, 7:51 AM

## 2017-11-22 NOTE — Lactation Note (Signed)
This note was copied from a baby's chart. Lactation Consultation Note  Patient Name: Boy Bennett ScrapeJalisa Vogelgesang WUJWJ'XToday's Date: 11/22/2017 Reason for consult: Initial assessment;1st time breastfeeding;Primapara;Early term 37-38.6wks;Infant < 6lbs  Visited with P1 Mom of ET infant, <6 lb baby at 2816 hrs old.  Mom resting in bed, baby loosely swaddled in crib.  Baby showing cues. Mom stating that breastfeeding is painful.  Mom assisted with placing baby STS in football hold on right breast.  Baby has a little mouth, and taught Mom importance of baby opening wide before she brings baby onto breast.   Breast massage and hand expression reviewed.  Colostrum expressed.  Encouraged Mom to do this prior to latching baby to the breast.  Comfortable latch attained after a couple attempts.  Mom wanting to pinch areola, so guided her hand back on breast. Mom denies pain and baby noted to have deep jaw extensions with occasional swallowing identified.  Taught Mom how to use alternate breast compression to increase milk transfer.  Plan- 1- Keep baby STS as much as possible, waking baby at 3 hrs for feeding if he is sleeping.  Feed baby sooner when he cues. 2- Breastfeed baby with a deep latch, ask for help prn 3- Pump both breasts for 15 mins on initiation setting.  Add breast massage and hand expression, and spoon feed baby colostrum after feedings.  RN to assist with this prn. 4- ask for help prn.  Asked RN to set up DEBP and instruct Mom to pump pc. Lactation brochure given to Mom.  Mom aware of IP and OP lactation services.  Encouraged her to call prn.  Interventions Interventions: Breast feeding basics reviewed;Assisted with latch;Skin to skin;Breast massage;Hand express;Breast compression;Adjust position;Support pillows;Position options;Expressed milk;DEBP  Lactation Tools Discussed/Used Tools: Pump Breast pump type: Double-Electric Breast Pump WIC Program: Yes Pump Review: Setup, frequency, and  cleaning;Milk Storage Initiated by:: Erby Pian Zeke Aker RN IBCLC Date initiated:: 11/22/17   Consult Status Consult Status: Follow-up Date: 11/23/17 Follow-up type: In-patient    Judee ClaraSmith, [place] Bash[place] E 11/22/2017, 5:06 PM

## 2017-11-23 DIAGNOSIS — O99824 Streptococcus B carrier state complicating childbirth: Secondary | ICD-10-CM

## 2017-11-23 DIAGNOSIS — O365930 Maternal care for other known or suspected poor fetal growth, third trimester, not applicable or unspecified: Secondary | ICD-10-CM

## 2017-11-23 DIAGNOSIS — Z3A38 38 weeks gestation of pregnancy: Secondary | ICD-10-CM

## 2017-11-23 NOTE — Progress Notes (Signed)
Post Partum Day 1 Subjective: Patient doing well and resting comfortably. No fevers. Urinating normally and has passed flatus and a bowel movement. She is breast and bottle feeding and plans for POP for contraception.  Objective: Blood pressure 126/79, pulse 67, temperature 97.8 F (36.6 C), temperature source Oral, resp. rate 18, height 5\' 5"  (1.651 m), weight 93 kg, last menstrual period 02/24/2017, SpO2 98 %, unknown if currently breastfeeding.  Physical Exam:  General: alert and no distress Lochia: minimal Uterine Fundus: nttp, below umbilicus Incision: n/a DVT Evaluation: no c/c/e in LEs  Recent Labs    11/21/17 2226  HGB 10.5*  HCT 30.6*    Assessment/Plan: Bennett ScrapeJalisa Suarez is a 32 year-old G2P1011 who is PPD #1 from SVD. She is doing well and is in good condition. She has remained afebrile post amp/gent for triple I. She has been restarted on Lovenox 40mg  qd and plan to continue this for 6 weeks. Plan for discharge tomorrow.   LOS: 4 days   Megan MeadBryan A Suarez 11/23/2017, 7:10 AM   Attestation of Attending Supervision of Medical Student: Evaluation and management procedures were performed by the Student under my supervision.  I have seen and examined the patient, reviewed the student's note and chart, and I agree with the management and plan. Exam same as above  [place] Bingharlie Kammi Hechler, Montez HagemanJr MD Attending Center for [facility](Faculty Practice)
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
## 2017-11-24 ENCOUNTER — Encounter (HOSPITAL_COMMUNITY): Payer: Self-pay | Admitting: *Deleted

## 2017-11-24 MED ORDER — ENOXAPARIN SODIUM 40 MG/0.4ML ~~LOC~~ SOLN: 40.0000 mg | SUBCUTANEOUS | 1 refills | Status: DC

## 2017-11-24 MED ORDER — IBUPROFEN 600 MG PO TABS: 600.0000 mg | ORAL_TABLET | Freq: Four times a day (QID) | ORAL | 0 refills | Status: DC | PRN

## 2017-11-24 NOTE — Lactation Note (Signed)
This note was copied from a baby's chart. Lactation Consultation Note; Mom getting ready to latch baby as I went into room. Reports her nipples are sore but using comfort gels with some improvement. Has coconut oil and encouraged EBM to nipples after nursing Assisted mom with getting deeper latch. Mom reports some pain with initial latch but eases off after a few minutes. Reviewed engorgement prevention and treatment. Plans to borrow pump from a friend. Has manual pump and DEBP pieces. Reviewed our phone number, OP appointments and BFSG as resources for support after DC. No questions at present. Nursing on the other breast as I left room.    Patient Name: Megan Suarez Reason for consult: Follow-up assessment   Maternal Data Formula Feeding for Exclusion: Yes Reason for exclusion: Mother's choice to formula and breast feed on admission Has patient been taught Hand Expression?: Yes Does the patient have breastfeeding experience prior to this delivery?: No  Feeding Feeding Type: Breast Fed  LATCH Score Latch: Grasps breast easily, tongue down, lips flanged, rhythmical sucking.  Audible Swallowing: A few with stimulation  Type of Nipple: Everted at rest and after stimulation  Comfort (Breast/Nipple): Filling, red/small blisters or bruises, mild/mod discomfort  Hold (Positioning): Assistance needed to correctly position infant at breast and maintain latch.  LATCH Score: 7  Interventions Interventions: Breast feeding basics reviewed;Assisted with latch;Breast compression;Hand express;Comfort gels;Coconut oil  Lactation Tools Discussed/Used     Consult Status Consult Status: Complete    Megan HoitWeeks, Megan Suarez, 10:14 AM

## 2017-11-24 NOTE — Discharge Instructions (Signed)
Vaginal Delivery, Care After °Refer to this sheet in the next few weeks. These instructions provide you with information about caring for yourself after vaginal delivery. Your health care provider may also give you more specific instructions. Your treatment has been planned according to current medical practices, but problems sometimes occur. Call your health care provider if you have any problems or questions. °What can I expect after the procedure? °After vaginal delivery, it is common to have: °· Some bleeding from your vagina. °· Soreness in your abdomen, your vagina, and the area of skin between your vaginal opening and your anus (perineum). °· Pelvic cramps. °· Fatigue. ° °Follow these instructions at home: °Medicines °· Take over-the-counter and prescription medicines only as told by your health care provider. °· If you were prescribed an antibiotic medicine, take it as told by your health care provider. Do not stop taking the antibiotic until it is finished. °Driving ° °· Do not drive or operate heavy machinery while taking prescription pain medicine. °· Do not drive for 24 hours if you received a sedative. °Lifestyle °· Do not drink alcohol. This is especially important if you are breastfeeding or taking medicine to relieve pain. °· Do not use tobacco products, including cigarettes, chewing tobacco, or e-cigarettes. If you need help quitting, ask your health care provider. °Eating and drinking °· Drink at least 8 eight-ounce glasses of water every day unless you are told not to by your health care provider. If you choose to breastfeed your baby, you may need to drink more water than this. °· Eat high-fiber foods every day. These foods may help prevent or relieve constipation. High-fiber foods include: °? Whole grain cereals and breads. °? Brown rice. °? Beans. °? Fresh fruits and vegetables. °Activity °· Return to your normal activities as told by your health care provider. Ask your health care provider  what activities are safe for you. °· Rest as much as possible. Try to rest or take a nap when your baby is sleeping. °· Do not lift anything that is heavier than your baby or 10 lb (4.5 kg) until your health care provider says that it is safe. °· Talk with your health care provider about when you can engage in sexual activity. This may depend on your: °? Risk of infection. °? Rate of healing. °? Comfort and desire to engage in sexual activity. °Vaginal Care °· If you have an episiotomy or a vaginal tear, check the area every day for signs of infection. Check for: °? More redness, swelling, or pain. °? More fluid or blood. °? Warmth. °? Pus or a bad smell. °· Do not use tampons or douches until your health care provider says this is safe. °· Watch for any blood clots that may pass from your vagina. These may look like clumps of dark red, brown, or black discharge. °General instructions °· Keep your perineum clean and dry as told by your health care provider. °· Wear loose, comfortable clothing. °· Wipe from front to back when you use the toilet. °· Ask your health care provider if you can shower or take a bath. If you had an episiotomy or a perineal tear during labor and delivery, your health care provider may tell you not to take baths for a certain length of time. °· Wear a bra that supports your breasts and fits you well. °· If possible, have someone help you with household activities and help care for your baby for at least a few days after   you leave the hospital. °· Keep all follow-up visits for you and your baby as told by your health care provider. This is important. °Contact a health care provider if: °· You have: °? Vaginal discharge that has a bad smell. °? Difficulty urinating. °? Pain when urinating. °? A sudden increase or decrease in the frequency of your bowel movements. °? More redness, swelling, or pain around your episiotomy or vaginal tear. °? More fluid or blood coming from your episiotomy or  vaginal tear. °? Pus or a bad smell coming from your episiotomy or vaginal tear. °? A fever. °? A rash. °? Little or no interest in activities you used to enjoy. °? Questions about caring for yourself or your baby. °· Your episiotomy or vaginal tear feels warm to the touch. °· Your episiotomy or vaginal tear is separating or does not appear to be healing. °· Your breasts are painful, hard, or turn red. °· You feel unusually sad or worried. °· You feel nauseous or you vomit. °· You pass large blood clots from your vagina. If you pass a blood clot from your vagina, save it to show to your health care provider. Do not flush blood clots down the toilet without having your health care provider look at them. °· You urinate more than usual. °· You are dizzy or light-headed. °· You have not breastfed at all and you have not had a menstrual period for 12 weeks after delivery. °· You have stopped breastfeeding and you have not had a menstrual period for 12 weeks after you stopped breastfeeding. °Get help right away if: °· You have: °? Pain that does not go away or does not get better with medicine. °? Chest pain. °? Difficulty breathing. °? Blurred vision or spots in your vision. °? Thoughts about hurting yourself or your baby. °· You develop pain in your abdomen or in one of your legs. °· You develop a severe headache. °· You faint. °· You bleed from your vagina so much that you fill two sanitary pads in one hour. °This information is not intended to replace advice given to you by your health care provider. Make sure you discuss any questions you have with your health care provider. °Document Released: 03/24/2000 Document Revised: 09/08/2015 Document Reviewed: 04/11/2015 °Elsevier Interactive Patient Education © 2018 Elsevier Inc. ° °
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
## 2017-11-26 ENCOUNTER — Ambulatory Visit: Payer: Self-pay

## 2017-11-26 NOTE — Lactation Note (Signed)
This note was copied from a baby's chart. Lactation Consultation Note  Patient Name: Boy Bennett ScrapeJalisa Godinho ZOXWR'UToday's Date: 11/26/2017 Reason for consult: Follow-up assessment   P561, Baby 144 days old.  2050w5d < 6 lbs. Baby is formula feeding and mother is also pumping. She pumped 20 ml last night but has not pumped since. Discussed supply and demand.  Encouraged pumping q 3 hours. Mother states she does have DEBP for home use.   LC returned to room and mother giving baby a bath submerged in water in basin. Provided education on sponge baby bath until cords falls off and suggest putting hat on baby and doing STS after bath. Reviewed engorgement care and monitoring voids/stools.     Maternal Data    Feeding Feeding Type: Bottle Fed - Formula Nipple Type: Slow - flow  LATCH Score                   Interventions    Lactation Tools Discussed/Used     Consult Status Consult Status: Complete    Hardie PulleyBerkelhammer, Ruth Boschen 11/26/2017, 10:22 AM

## 2017-12-03 ENCOUNTER — Ambulatory Visit (INDEPENDENT_AMBULATORY_CARE_PROVIDER_SITE_OTHER): Payer: Medicaid Other | Admitting: Women's Health

## 2017-12-03 VITALS — BP 137/99 | HR 72 | Ht 66.0 in | Wt 187.0 lb

## 2017-12-03 DIAGNOSIS — O165 Unspecified maternal hypertension, complicating the puerperium: Secondary | ICD-10-CM | POA: Diagnosis not present

## 2017-12-03 MED ORDER — AMLODIPINE BESYLATE 5 MG PO TABS: 5.0000 mg | ORAL_TABLET | Freq: Every day | ORAL | 0 refills | Status: DC

## 2017-12-03 NOTE — Patient Instructions (Addendum)
Stop blood pressure medicine 2 days before your postpartum visit  Call the office [phone]) or [facility] for these signs of pre-eclampsia:  Severe headache that does not go away with Tylenol  Visual changes- seeing spots, double, blurred vision  Pain under your right breast or upper abdomen that does not go away with Tums or heartburn medicine  Nausea and/or vomiting  Severe swelling in your hands, feet, and face     Tips To Increase Milk Supply  Lots of water! Enough so that your urine is clear  Plenty of calories, if you're not getting enough calories, your milk supply can decrease  Breastfeed/pump often, every 2-3 hours x 20-6130mins  Fenugreek 3 pills 3 times a day, this may make your urine smell like maple syrup  Mother's Milk Tea  Lactation cookies, google for the recipe  Real oatmeal  Body Armour

## 2017-12-03 NOTE — Progress Notes (Signed)
   GYN VISIT Patient name: Megan Suarez MRN [id number]  Date of birth: Jul 02, 1985 Chief Complaint:   Blood Pressure Check  History of Present Illness:   Megan LacyJalisa R Shimp is a 32 y.o. 332P1011 African American female 11d s/p SVB being seen today for bp check. Had a few elevated bp's pp in hospital, was not d/c'd on meds. Has had daily headache in pm. Denies visual changes, ruq/epigastric pain, etc. Breast and bottlefeeding.    No LMP recorded. Review of Systems:   Pertinent items are noted in HPI Denies fever/chills, dizziness, headaches, visual disturbances, fatigue, shortness of breath, chest pain, abdominal pain, vomiting, abnormal vaginal discharge/itching/odor/irritation, problems with periods, bowel movements, urination, or intercourse unless otherwise stated above.  Pertinent History Reviewed:  Reviewed past medical,surgical, social, obstetrical and family history.  Reviewed problem list, medications and allergies. Physical Assessment:   Vitals:   12/03/17 1524  BP: (!) 137/99  Pulse: 72  Weight: 187 lb (84.8 kg)  Height: 5\' 6"  (1.676 m)  Body mass index is 30.18 kg/m.       Physical Examination:   General appearance: alert, well appearing, and in no distress  Mental status: alert, oriented to person, place, and time  Skin: warm & dry   Cardiovascular: normal heart rate noted  Respiratory: normal respiratory effort, no distress  Abdomen: soft, non-tender   Pelvic: examination not indicated  Extremities: no edema   No results found for this or any previous visit (from the past 24 hour(s)).  Assessment & Plan:  1) 11d s/p SVB> breast&bottlefeeding  2) PP HTN> rx Norvasc 5mg  daily, return in 2d for bp check w/ nurse, take norvasc at least 1hr before coming. Stop norvasc 2d prior to pp visit  Meds:  Meds ordered this encounter  Medications  . amLODipine (NORVASC) 5 MG tablet    Sig: Take 1 tablet (5 mg total) by mouth daily.    Dispense:  30 tablet    Refill:  0   Order Specific Question:   Supervising Provider    Answer:   Despina HiddenEURE, LUTHER H [[id number]]    No orders of the defined types were placed in this encounter.   Return for 2d for bp check w/ nurse; As scheduled 9/20 for ppvisit.  Cheral MarkerKimberly R Ianna Salmela CNM, [facility]NP-BC 12/03/2017 3:53 PM

## 2017-12-28 ENCOUNTER — Ambulatory Visit: Payer: Medicaid Other | Admitting: Women's Health

## 2018-05-01 ENCOUNTER — Ambulatory Visit: Payer: Medicaid Other | Admitting: Obstetrics and Gynecology

## 2018-05-02 ENCOUNTER — Ambulatory Visit: Payer: Medicaid Other | Admitting: Adult Health

## 2021-05-18 ENCOUNTER — Emergency Department (HOSPITAL_COMMUNITY): Payer: Medicaid Other

## 2021-05-18 ENCOUNTER — Emergency Department (HOSPITAL_COMMUNITY)
Admission: EM | Admit: 2021-05-18 | Discharge: 2021-05-18 | Disposition: A | Discharge status: Home / Self Care | Payer: Medicaid Other | Attending: Emergency Medicine | Admitting: Emergency Medicine

## 2021-05-18 ENCOUNTER — Other Ambulatory Visit: Payer: Self-pay

## 2021-05-18 ENCOUNTER — Encounter (HOSPITAL_COMMUNITY): Payer: Self-pay | Admitting: *Deleted

## 2021-05-18 DIAGNOSIS — Z79899 Other long term (current) drug therapy: Secondary | ICD-10-CM | POA: Insufficient documentation

## 2021-05-18 DIAGNOSIS — T801XXA Vascular complications following infusion, transfusion and therapeutic injection, initial encounter: Secondary | ICD-10-CM | POA: Insufficient documentation

## 2021-05-18 DIAGNOSIS — R2231 Localized swelling, mass and lump, right upper limb: Secondary | ICD-10-CM | POA: Diagnosis present

## 2021-05-18 DIAGNOSIS — L539 Erythematous condition, unspecified: Secondary | ICD-10-CM | POA: Insufficient documentation

## 2021-05-18 DIAGNOSIS — R229 Localized swelling, mass and lump, unspecified: Secondary | ICD-10-CM

## 2021-05-18 DIAGNOSIS — I809 Phlebitis and thrombophlebitis of unspecified site: Secondary | ICD-10-CM

## 2021-05-18 MED ORDER — IBUPROFEN 400 MG PO TABS
400.0000 mg | ORAL_TABLET | Freq: Once | ORAL | Status: AC
  Administered 2021-05-18: 400 mg via ORAL
  Filled 2021-05-18: qty 1

## 2021-05-18 MED ORDER — IBUPROFEN 600 MG PO TABS: 600.0000 mg | ORAL_TABLET | Freq: Three times a day (TID) | ORAL | 0 refills | Status: DC
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
## 2022-02-16 DIAGNOSIS — F900 Attention-deficit hyperactivity disorder, predominantly inattentive type: Secondary | ICD-10-CM | POA: Diagnosis not present

## 2022-02-16 DIAGNOSIS — F331 Major depressive disorder, recurrent, moderate: Secondary | ICD-10-CM | POA: Diagnosis not present

## 2022-03-01 DIAGNOSIS — Z114 Encounter for screening for human immunodeficiency virus [HIV]: Secondary | ICD-10-CM | POA: Diagnosis not present

## 2022-03-01 DIAGNOSIS — Z3009 Encounter for other general counseling and advice on contraception: Secondary | ICD-10-CM | POA: Diagnosis not present

## 2022-03-01 DIAGNOSIS — N926 Irregular menstruation, unspecified: Secondary | ICD-10-CM | POA: Diagnosis not present

## 2022-03-01 DIAGNOSIS — N760 Acute vaginitis: Secondary | ICD-10-CM | POA: Diagnosis not present

## 2022-03-19 ENCOUNTER — Emergency Department (HOSPITAL_COMMUNITY): Payer: Medicaid Other

## 2022-03-19 ENCOUNTER — Emergency Department (HOSPITAL_COMMUNITY)
Admission: EM | Admit: 2022-03-19 | Discharge: 2022-03-19 | Disposition: A | Discharge status: Home / Self Care | Payer: Medicaid Other | Attending: Emergency Medicine | Admitting: Emergency Medicine

## 2022-03-19 DIAGNOSIS — R0789 Other chest pain: Secondary | ICD-10-CM | POA: Diagnosis not present

## 2022-03-19 DIAGNOSIS — Z1152 Encounter for screening for COVID-19: Secondary | ICD-10-CM | POA: Diagnosis not present

## 2022-03-19 DIAGNOSIS — I4510 Unspecified right bundle-branch block: Secondary | ICD-10-CM | POA: Diagnosis not present

## 2022-03-19 DIAGNOSIS — R079 Chest pain, unspecified: Secondary | ICD-10-CM

## 2022-03-19 DIAGNOSIS — I10 Essential (primary) hypertension: Secondary | ICD-10-CM | POA: Diagnosis not present

## 2022-03-19 LAB — COMPREHENSIVE METABOLIC PANEL
ALT: 32 U/L (ref 0–44)
AST: 21 U/L (ref 15–41)
Albumin: 3.6 g/dL (ref 3.5–5.0)
Alkaline Phosphatase: 51 U/L (ref 38–126)
Anion gap: 7 (ref 5–15)
BUN: 16 mg/dL (ref 6–20)
CO2: 27 mmol/L (ref 22–32)
Calcium: 8.9 mg/dL (ref 8.9–10.3)
Chloride: 102 mmol/L (ref 98–111)
Creatinine, Ser: 0.97 mg/dL (ref 0.44–1.00)
GFR, Estimated: >60 mL/min (ref >60)
Glucose, Bld: 96 mg/dL (ref 70–99)
Potassium: 3.6 mmol/L (ref 3.5–5.1)
Sodium: 136 mmol/L (ref 135–145)
Total Bilirubin: 0.7 mg/dL (ref 0.3–1.2)
Total Protein: 6.9 g/dL (ref 6.5–8.1)

## 2022-03-19 LAB — CBC
HCT: 38.3 % (ref 36.0–46.0)
Hemoglobin: 12 g/dL (ref 12.0–15.0)
MCH: 27.8 pg (ref 26.0–34.0)
MCHC: 31.3 g/dL (ref 30.0–36.0)
MCV: 88.9 fL (ref 80.0–100.0)
Platelets: 256 10*3/uL (ref 150–400)
RBC: 4.31 MIL/uL (ref 3.87–5.11)
RDW: 13.1 % (ref 11.5–15.5)
WBC: 4.7 10*3/uL (ref 4.0–10.5)
nRBC: 0 % (ref 0.0–0.2)

## 2022-03-19 LAB — D-DIMER, QUANTITATIVE: D-Dimer, Quant: 0.27 ug/mL-FEU (ref 0.00–0.50)

## 2022-03-19 LAB — RESP PANEL BY RT-PCR (RSV, FLU A&B, COVID)  RVPGX2
Influenza A by PCR: NEGATIVE
Influenza B by PCR: NEGATIVE
Resp Syncytial Virus by PCR: NEGATIVE
SARS Coronavirus 2 by RT PCR: NEGATIVE

## 2022-03-19 LAB — TROPONIN I (HIGH SENSITIVITY): Troponin I (High Sensitivity): 2 ng/L (ref <18)

## 2022-03-19 NOTE — ED Provider Notes (Signed)
[facility] EMERGENCY DEPARTMENT Provider Note   CSN: [id number] Arrival date & time: 03/19/22  1110     History  Chief Complaint  Patient presents with   Chest Pain    Megan Suarez is a 36 y.o. female, history of PE, who presents to the ED secondary to intermittent chest pain that is been going on for the last 4 to 5 days.  She endorses upper respiratory symptoms such as dry cough, sore throat, and fatigue, however states that for the last 4 to 5 days she has been having chest discomfort that is worse in the morning, worse when she raises her chest, and it reports chest wall tenderness.  Endorses some shortness of breath, and feeling like she cannot take a deep breath.  Denies any birth control usage, no recent trauma.  No fever or chills.  States the pain is random in nature, not associated with deep breaths.     Home Medications Prior to Admission medications   Medication Sig Start Date End Date Taking? Authorizing Provider  amLODipine (NORVASC) 5 MG tablet Take 1 tablet (5 mg total) by mouth daily. 12/03/17   Cheral Marker, CNM  enoxaparin (LOVENOX) 40 MG/0.4ML injection Inject 0.4 mLs (40 mg total) into the skin daily. 11/24/17   Arabella Merles, CNM  ferrous sulfate 325 (65 FE) MG tablet Take 1 tablet (325 mg total) by mouth 2 (two) times daily with a meal. Patient not taking: Reported on 12/03/2017 09/24/17   Cheral Marker, CNM  ibuprofen (ADVIL) 600 MG tablet Take 1 tablet (600 mg total) by mouth 3 (three) times daily. 05/18/21   Burgess Amor, PA-C  Prenatal MV-Min-Fe Fum-FA-DHA (PRENATAL 1 PO) Take by mouth.    [provider]      Allergies    Patient has no known allergies.    Review of Systems   Review of Systems  Constitutional:  Negative for chills and fever.  HENT:  Positive for sore throat. Negative for ear pain.   Respiratory:  Positive for shortness of breath.   Cardiovascular:  Positive for chest pain.    Physical Exam Updated Vital  Signs BP (!) 151/98   Pulse 73   Temp 98.5 F (36.9 C) (Oral)   Resp 12   SpO2 99%  Physical Exam Vitals and nursing note reviewed.  Constitutional:      General: She is not in acute distress.    Appearance: She is well-developed.  HENT:     Head: Normocephalic and atraumatic.  Eyes:     Conjunctiva/sclera: Conjunctivae normal.  Cardiovascular:     Rate and Rhythm: Normal rate and regular rhythm.     Heart sounds: No murmur heard. Pulmonary:     Effort: Pulmonary effort is normal. No respiratory distress.     Breath sounds: Normal breath sounds.  Chest:     Chest wall: Tenderness present.  Abdominal:     Palpations: Abdomen is soft.     Tenderness: There is no abdominal tenderness.  Musculoskeletal:        General: No swelling.     Cervical back: Neck supple.  Skin:    General: Skin is warm and dry.     Capillary Refill: Capillary refill takes less than 2 seconds.  Neurological:     Mental Status: She is alert.  Psychiatric:        Mood and Affect: Mood normal.     ED Results / Procedures / Treatments   Labs (all  labs ordered are listed, but only abnormal results are displayed) Labs Reviewed  RESP PANEL BY RT-PCR (RSV, FLU A&B, COVID)  [id number]  CBC  COMPREHENSIVE METABOLIC PANEL  D-DIMER, QUANTITATIVE  POC URINE PREG, ED  TROPONIN I (HIGH SENSITIVITY)    EKG EKG Interpretation  Date/Time:  Sunday March 19 2022 11:20:06 EST Ventricular Rate:  65 PR Interval:  168 QRS Duration: 102 QT Interval:  428 QTC Calculation: 445 R Axis:   56 Text Interpretation: Sinus rhythm no acute ST/T changes Confirmed by Pricilla Loveless [phone]) on 03/19/2022 11:23:09 AM  Radiology DG Chest 2 View  Result Date: 03/19/2022 CLINICAL DATA:  Chest pain over the last 3 days EXAM: CHEST - 2 VIEW COMPARISON:  CT 02/14/2017 FINDINGS: Heart size is normal. Mediastinal shadows are normal. The lungs are clear. No bronchial thickening. No infiltrate, mass, effusion or collapse.  Pulmonary vascularity is normal. No bony abnormality. IMPRESSION: Normal chest. Electronically Signed   By: Paulina Fusi M.D.   On: 03/19/2022 11:53    Procedures Procedures    Medications Ordered in ED Medications - No data to display  ED Course/ Medical Decision Making/ A&P                           Medical Decision Making Patient is a 36 year old female, here for chest discomfort for the last 4 to 5 days.  Associated with some shortness of breath, and reports URI symptoms, and cough.  She does have tenderness to palpation of her chest wall.  Does have a history of PE, that she states was unprovoked, thus it we will obtain a D-dimer, she is nonhypoxic, nontoxic looking, and not tachycardic on exam.  Heart score is 0.   Amount and/or Complexity of Data Reviewed Labs: ordered.    Details: Unremarkable, negative D-dimer, troponin. Radiology: ordered.    Details: Chest x-ray unremarkable. Discussion of management or test interpretation with external provider(s): Discussed with him, and normal labs, radiology.  Discussed possibly secondary to muscle discomfort from cough, URI symptoms.  Discussed importance of follow-up with primary care doctor and return precautions.    Final Clinical Impression(s) / ED Diagnoses Final diagnoses:  Chest pain, unspecified type    Rx / DC Orders ED Discharge Orders     None         Haylynn Pha, Harley Alto, PA 03/19/22 1343    Pricilla Loveless, MD 03/22/22 [phone]

## 2022-03-19 NOTE — ED Triage Notes (Signed)
Pt arrives via [facility] c/o intermittent CP for three days. Pt denies any associated symptoms. No cardiac hx per pt. Pt denies pain currently.

## 2022-03-19 NOTE — Discharge Instructions (Signed)
Please follow-up with your primary care doctor.  You can use Biofreeze, ibuprofen, Tylenol for pain control.  If you have fever, chills, worsening pain, shortness of breath please return to the ER.

## 2022-05-29 ENCOUNTER — Inpatient Hospital Stay (HOSPITAL_COMMUNITY): Payer: Medicaid Other

## 2022-05-29 ENCOUNTER — Inpatient Hospital Stay (HOSPITAL_COMMUNITY)
Admission: AD | Admit: 2022-05-29 | Discharge: 2022-05-29 | Disposition: A | Discharge status: Home / Self Care | Payer: Medicaid Other | Attending: Family Medicine | Admitting: Family Medicine

## 2022-05-29 ENCOUNTER — Encounter (HOSPITAL_COMMUNITY): Payer: Self-pay | Admitting: *Deleted

## 2022-05-29 DIAGNOSIS — O4691 Antepartum hemorrhage, unspecified, first trimester: Secondary | ICD-10-CM | POA: Diagnosis present

## 2022-05-29 DIAGNOSIS — O200 Threatened abortion: Secondary | ICD-10-CM

## 2022-05-29 DIAGNOSIS — O209 Hemorrhage in early pregnancy, unspecified: Secondary | ICD-10-CM | POA: Insufficient documentation

## 2022-05-29 DIAGNOSIS — O26899 Other specified pregnancy related conditions, unspecified trimester: Secondary | ICD-10-CM

## 2022-05-29 DIAGNOSIS — Z3A01 Less than 8 weeks gestation of pregnancy: Secondary | ICD-10-CM | POA: Insufficient documentation

## 2022-05-29 DIAGNOSIS — N939 Abnormal uterine and vaginal bleeding, unspecified: Secondary | ICD-10-CM

## 2022-05-29 HISTORY — DX: Hypothyroidism, unspecified: E03.9

## 2022-05-29 HISTORY — DX: Urinary tract infection, site not specified: N39.0

## 2022-05-29 HISTORY — DX: Anemia, unspecified: D64.9

## 2022-05-29 HISTORY — DX: Gestational (pregnancy-induced) hypertension without significant proteinuria, unspecified trimester: O13.9

## 2022-05-29 LAB — CBC
HCT: 33.9 % — ABNORMAL LOW (ref 36.0–46.0)
Hemoglobin: 10.7 g/dL — ABNORMAL LOW (ref 12.0–15.0)
MCH: 27.4 pg (ref 26.0–34.0)
MCHC: 31.6 g/dL (ref 30.0–36.0)
MCV: 86.9 fL (ref 80.0–100.0)
Platelets: 213 10*3/uL (ref 150–400)
RBC: 3.9 MIL/uL (ref 3.87–5.11)
RDW: 13.4 % (ref 11.5–15.5)
WBC: 6.8 10*3/uL (ref 4.0–10.5)
nRBC: 0 % (ref 0.0–0.2)

## 2022-05-29 LAB — ABO/RH: ABO/RH(D): A POS

## 2022-05-29 LAB — HCG, QUANTITATIVE, PREGNANCY: hCG, Beta Chain, Quant, S: 7784 m[IU]/mL — ABNORMAL HIGH (ref <5)

## 2022-05-29 LAB — POCT PREGNANCY, URINE: Preg Test, Ur: POSITIVE — AB
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
## 2022-05-29 NOTE — MAU Provider Note (Signed)
Chief Complaint:  Vaginal Bleeding, Abdominal Pain, and Possible Pregnancy   Event Date/Time   First Provider Initiated Contact with Patient 05/29/22 1235     HPI: Megan Suarez is a 37 y.o. G3P1011 at 62w4dwho presents to maternity admissions via EMS reporting positive pregnancy test two weeks ago followed by light spotting that became heavy overnight. Passing large hand sized clots with intense cramping and a heavy flow. The last wave was very painful, caused lightheadedness as she passed the clot and scared her so she called EMS. No other physical complaints.  Pregnancy Course: Has not established prenatal care yet, has previously received care from [facility]  Past Medical History:  Diagnosis Date   Anemia    [place]   Dysmenorrhea 10/15/2014   Dyspareunia 10/15/2014   Hypothyroid    Menorrhagia with regular cycle 10/15/2014   Mental disorder    Pelvic pain in female 10/15/2014   Pregnancy induced hypertension    Pulmonary embolism complicating pregnancy [id number]  Pulmonary embolus (HCC)    UTI (urinary tract infection)    Vaginal Pap smear, abnormal    OB History  Gravida Para Term Preterm AB Living  '3 1 1 '$ 0 1 1  SAB IAB Ectopic Multiple Live Births  1 0 0 0 1    # Outcome Date GA Lbr Len/2nd Weight Sex Delivery Anes PTL Lv  3 Current           2 Term 11/22/17 347w5d 00:27 5 lb 6.8 oz (2.461 kg) M Vag-Spont EPI  LIV  1 SAB 2017           Past Surgical History:  Procedure Laterality Date   NO PAST SURGERIES     Family History  Adopted: Yes   Social History   Tobacco Use   Smoking status: Former    Types: Cigarettes   Smokeless tobacco: Never   Tobacco comments:    2018  Vaping Use   Vaping Use: Some days  Substance Use Topics   Alcohol use: Not Currently   Drug use: Not Currently    Types: Marijuana    Comment: 2019   No Known Allergies No medications prior to admission.    I have reviewed patient's Past Medical Hx,  Surgical Hx, Family Hx, Social Hx, medications and allergies.   ROS:  Pertinent items noted in HPI and remainder of comprehensive ROS otherwise negative.   Physical Exam   05/29/22 1337 -- 73 -- 130/73 --  05/29/22 1132 99 F (37.2 C) 75 18 141/81 Abnormal  99 %   Constitutional: Well-developed, well-nourished female in no acute distress.  Cardiovascular: normal rate & rhythm, warm and well-perfused Respiratory: normal effort, no problems with respiration noted GI: Abd soft, non-tender, gravid appropriate for gestational age MS: Extremities nontender, no edema, normal ROM Neurologic: Alert and oriented x 4.  GU: no CVA tenderness Pelvic: moderate bleeding, no clots on admission. Sent to U/S   Labs: CBC    Component Value Date/Time   WBC 6.8 05/29/2022 1242   RBC 3.90 05/29/2022 1242   HGB 10.7 (L) 05/29/2022 1242   HGB 10.6 (L) 09/21/2017 0920   HCT 33.9 (L) 05/29/2022 1242   HCT 32.9 (L) 09/21/2017 0920   PLT 213 05/29/2022 1242   PLT 180 09/21/2017 0920   MCV 86.9 05/29/2022 1242   MCV 87 09/21/2017 0920   MCH 27.4 05/29/2022 1242   MCHC 31.6 05/29/2022 1242   RDW 13.4 05/29/2022  1242   RDW 14.5 09/21/2017 0920   LYMPHSABS 1.5 07/16/2017 1237   MONOABS 0.6 03/11/2015 2315   EOSABS 0.1 07/16/2017 1237   BASOSABS 0.0 07/16/2017 1237   Beta hCG: 7,784  ABO/Rh(D): A pos  Imaging:    US OB LESS THAN 14 WEEKS WITH OB TRANSVAGINAL (Accession [id number]) (Order [id number]) Imaging Date: 05/29/2022 Department: [facility] Released By/Authorizing: Gabriel Carina, CNM (auto-released)   Exam Status  Status  Final [99]   PACS Intelerad Image Link   Show images for US OB LESS THAN 14 WEEKS WITH OB TRANSVAGINAL Study Result  Narrative & Impression  CLINICAL DATA:  Pregnant.  Vaginal bleeding   EXAM: OBSTETRIC <14 WK [place] AND TRANSVAGINAL OB US   TECHNIQUE: Both transabdominal and transvaginal ultrasound examinations were performed for  complete evaluation of the gestation as well as the maternal uterus, adnexal regions, and pelvic cul-de-sac. Transvaginal technique was performed to assess early pregnancy.   COMPARISON:  None Available.   FINDINGS: Intrauterine gestational sac: None   Yolk sac:  Not Visualized.   Embryo:  Not Visualized.   Cardiac Activity: Not Visualized.   Uterus: Abdominal imaging slightly limited by overlapping bowel gas and soft tissue. Poor sonographic window with the contracted bladder. Slightly heterogeneous myometrium. Thickened and heterogeneous endometrium. No IUP. Endometrial thickness approaches 2.2 cm.   Adnexa: Right ovary measures 3.3 x 2.1 x 2.8 cm. Small follicles. Left ovary is not seen with overlapping bowel gas and soft tissue. No significant free fluid   IMPRESSION: No intrauterine pregnancy identified with a thickened heterogeneous endometrium. With this history, this could be a spontaneous abortion. Recommend close follow-up with serial beta HCG and ultrasound to confirm etiology and exclude ectopic. No free fluid in the pelvis.   Poor visualization of the left ovary at this time   Electronically Signed   By: Jill Side M.D.   On: 05/29/2022 13:20      MAU Course: Orders Placed This Encounter  Procedures   US OB LESS THAN 14 WEEKS WITH OB TRANSVAGINAL   CBC   hCG, quantitative, pregnancy   Pregnancy, urine POC   ABO/Rh   Discharge patient   No orders of the defined types were placed in this encounter.  Bleeding moderately but stable on admission. Labs drawn, discussed plan and possible explanations for bleeding with patient and sent to U/S. Complained of left sided abdominal pain but on palpation, pain is centralized. Left and right adnexa non-tender.  Reviewed results with patient and discussed likelihood of miscarriage given appearance of clots at home, slowed bleeding and absence of IUGS. Reviewed pregnany of unknown location, ectopic precautions and  follow up in two days for repeat bHCG. Pt expressed understanding, lab appt in two days made at [facility].  MDM: Moderate  Assessment: 1. Threatened miscarriage   2. Less than [redacted] weeks gestation of pregnancy   3. Vaginal bleeding   4. Cramping affecting pregnancy, antepartum    Plan: Discharge home in stable condition with ectopic/bleeding precautions.    Follow-up [facility]. Go in 2 day(s).   Specialty: Obstetrics and Gynecology Why: Follow up blood work Contact information: [place] [phone]                Allergies as of 05/29/2022   No Known Allergies      Medication List     STOP  taking these medications    amLODipine 5 MG tablet Commonly known as: Norvasc   enoxaparin 40 MG/0.4ML injection Commonly known as: LOVENOX   ferrous sulfate 325 (65 FE) MG tablet       TAKE these medications    amphetamine-dextroamphetamine 10 MG tablet Commonly known as: ADDERALL Take 10 mg by mouth daily with breakfast. "Instant release"   amphetamine-dextroamphetamine 25 MG 24 hr capsule Commonly known as: ADDERALL XR Take 25 mg by mouth every morning.   ibuprofen 600 MG tablet Commonly known as: ADVIL Take 1 tablet (600 mg total) by mouth 3 (three) times daily.   PRENATAL 1 PO Take by mouth.       Gaylan Gerold, CNM, MSN, [place] Certified Nurse Midwife, [facility]

## 2022-05-31 ENCOUNTER — Other Ambulatory Visit: Payer: Medicaid Other

## 2022-06-26 ENCOUNTER — Telehealth: Payer: Self-pay

## 2022-06-26 NOTE — Telephone Encounter (Signed)
I spoke with patient about her concerns. She recently had a miscarriage and didn't follow up for her repeat hcg level. She stopped bleeding and now has started again. She is worried that she may had retained products because of the spotting again. She wonders if she needs to be seen or go have the labs drawn. Advised I would discuss with provider and call her back.

## 2022-06-26 NOTE — Telephone Encounter (Signed)
Pt called and stated that she had a miscarriage 3 weeks ago and is still experiencing heavy bleeding and wants to speak with a nurse.

## 2022-07-26 ENCOUNTER — Encounter: Payer: Medicaid Other | Admitting: Adult Health

## 2022-12-23 ENCOUNTER — Encounter (HOSPITAL_COMMUNITY): Payer: Self-pay | Admitting: Emergency Medicine

## 2022-12-23 ENCOUNTER — Other Ambulatory Visit: Payer: Self-pay

## 2022-12-23 ENCOUNTER — Emergency Department (HOSPITAL_COMMUNITY): Payer: Medicaid Other

## 2022-12-23 ENCOUNTER — Emergency Department (HOSPITAL_COMMUNITY)
Admission: EM | Admit: 2022-12-23 | Discharge: 2022-12-23 | Disposition: A | Discharge status: Home / Self Care | Payer: Medicaid Other | Attending: Emergency Medicine | Admitting: Emergency Medicine

## 2022-12-23 DIAGNOSIS — S01512A Laceration without foreign body of oral cavity, initial encounter: Secondary | ICD-10-CM | POA: Diagnosis not present

## 2022-12-23 DIAGNOSIS — S0993XA Unspecified injury of face, initial encounter: Secondary | ICD-10-CM | POA: Diagnosis present

## 2022-12-23 DIAGNOSIS — S0083XA Contusion of other part of head, initial encounter: Secondary | ICD-10-CM | POA: Insufficient documentation

## 2022-12-23 MED ORDER — AMOXICILLIN 500 MG PO CAPS: 500.0000 mg | ORAL_CAPSULE | Freq: Three times a day (TID) | ORAL | 0 refills | Status: AC

## 2022-12-23 MED ORDER — IBUPROFEN 800 MG PO TABS: 800.0000 mg | ORAL_TABLET | Freq: Four times a day (QID) | ORAL | 0 refills | Status: AC | PRN
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
## 2022-12-23 NOTE — ED Notes (Signed)
Pt given ice pack to place on right side of face/jaw

## 2022-12-23 NOTE — Discharge Instructions (Signed)
Rinse your mouth out with warm salt water or a small amount of peroxide mixed with water after you eat.

## 2023-06-07 IMAGING — US US EXTREM  UP VENOUS*R*
1 series · 13 of 24 positions shown · non-contrast
Comparison: None.

CLINICAL DATA: Pain



[Series 1: us venous img upper uni right (dvt) · portal-venous · 13 of 38 slices shown]
[im 1/38]
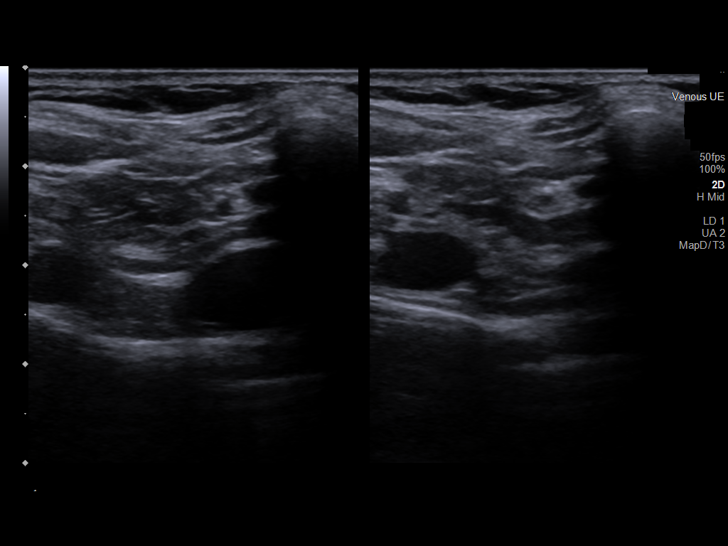
[im 4/38]
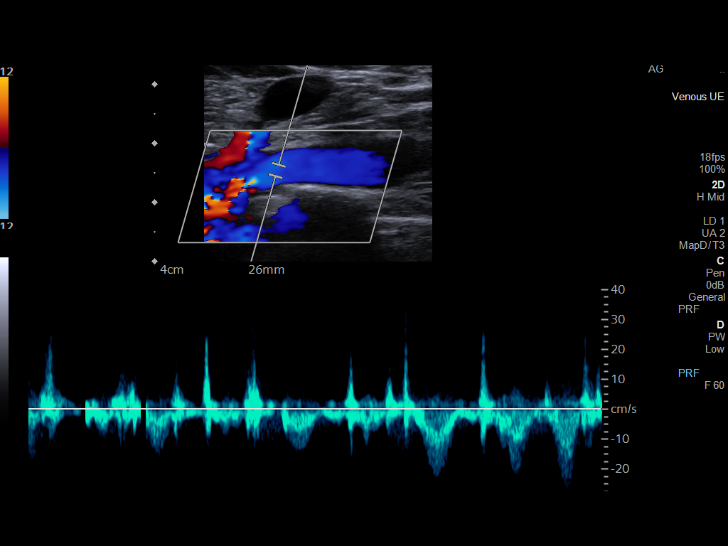
[im 7/38]
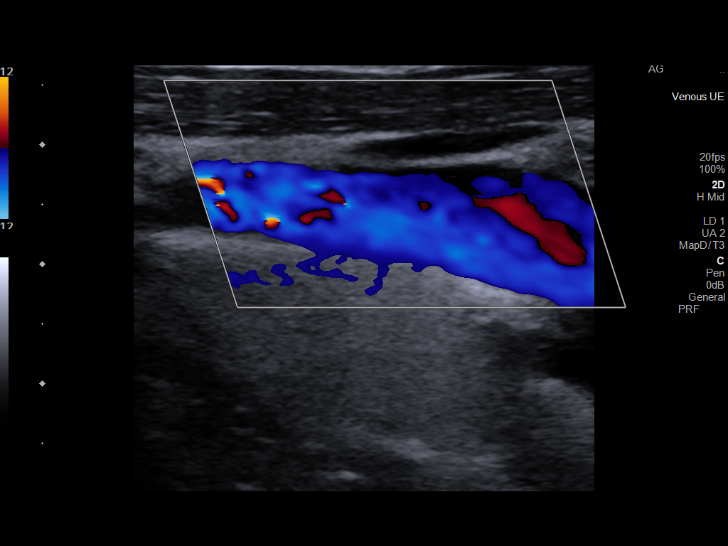
[im 10/38]
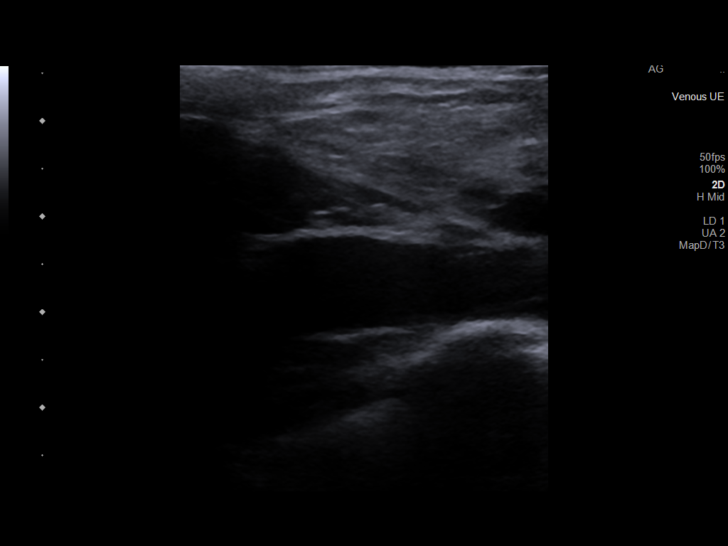
[im 13/38]
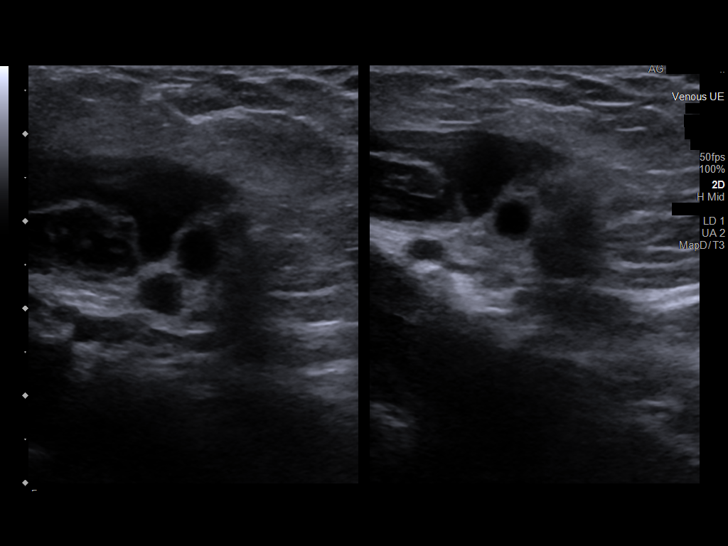
[im 17/38]
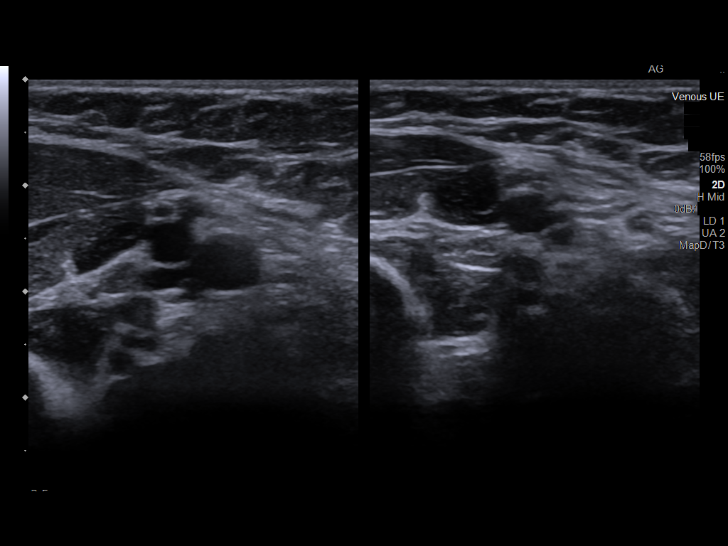
[im 20/38]
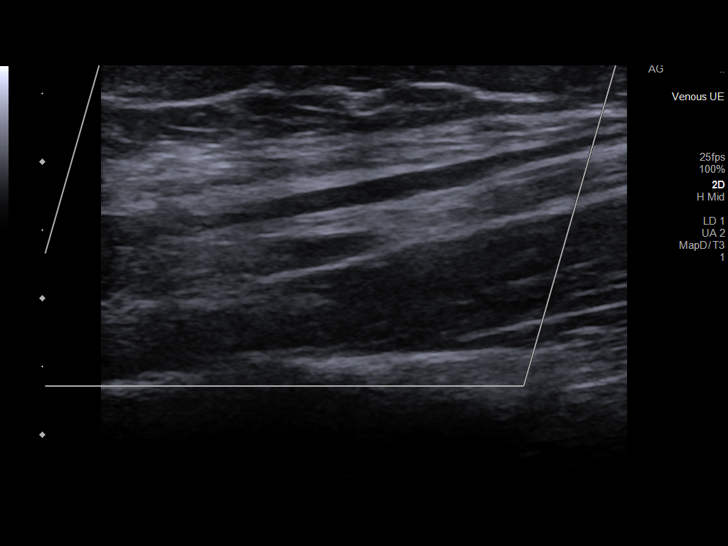
[im 21/38]
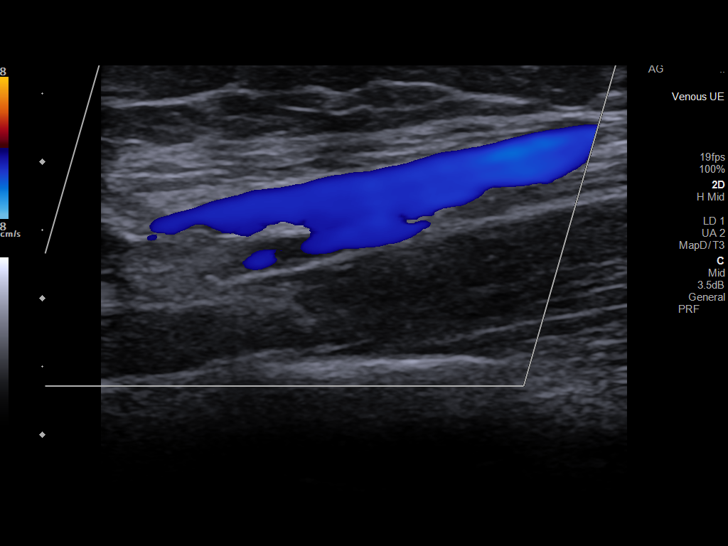
[im 25/38]
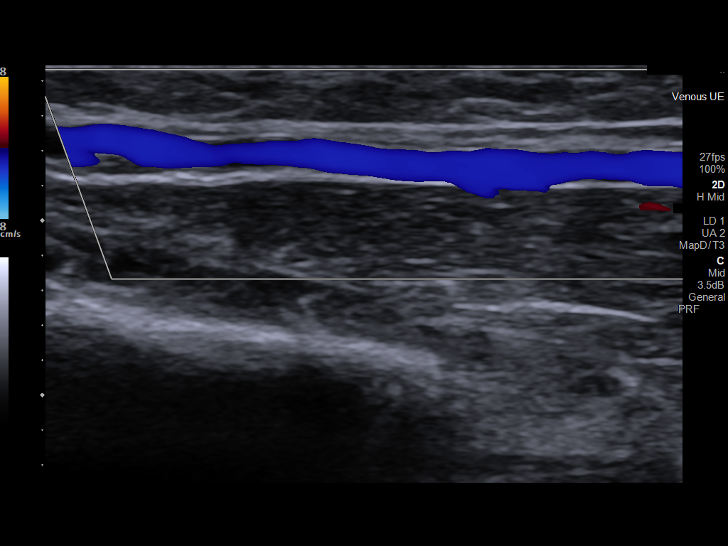
[im 28/38]
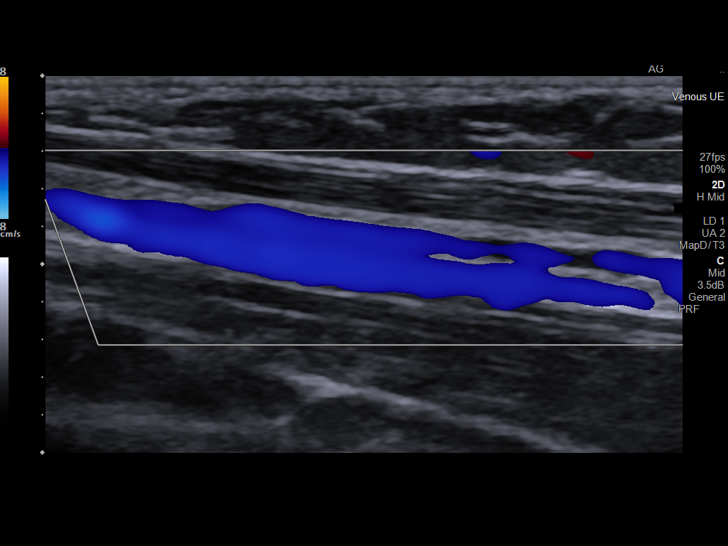
[im 31/38]
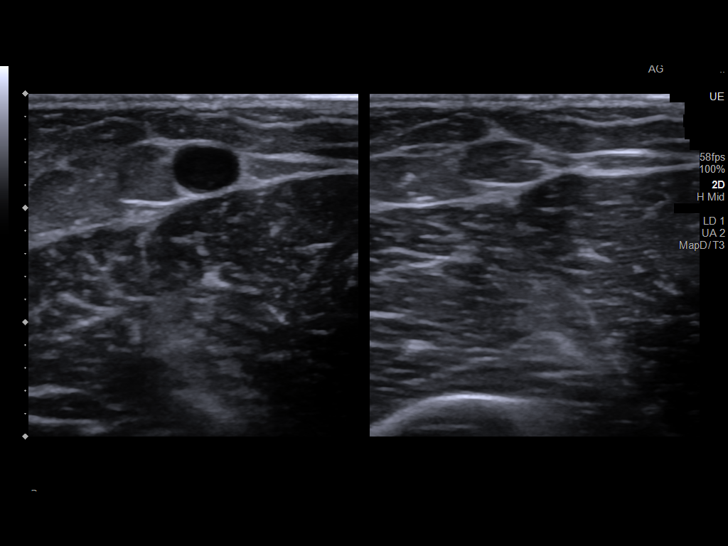
[im 34/38]
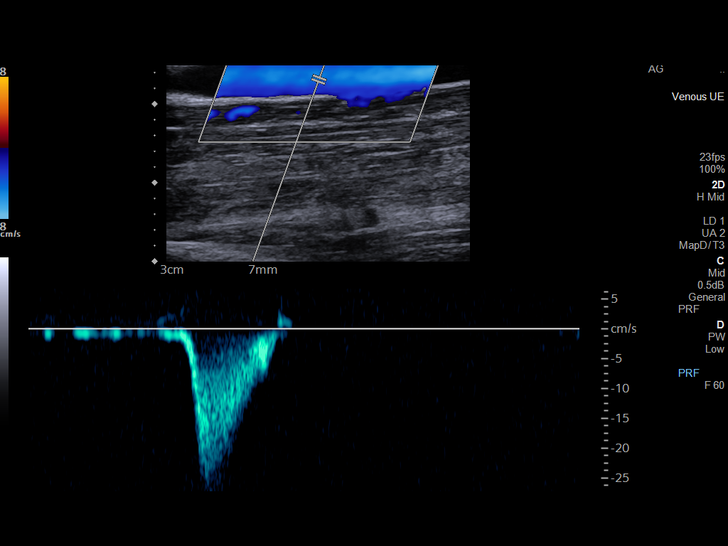
[im 38/38]
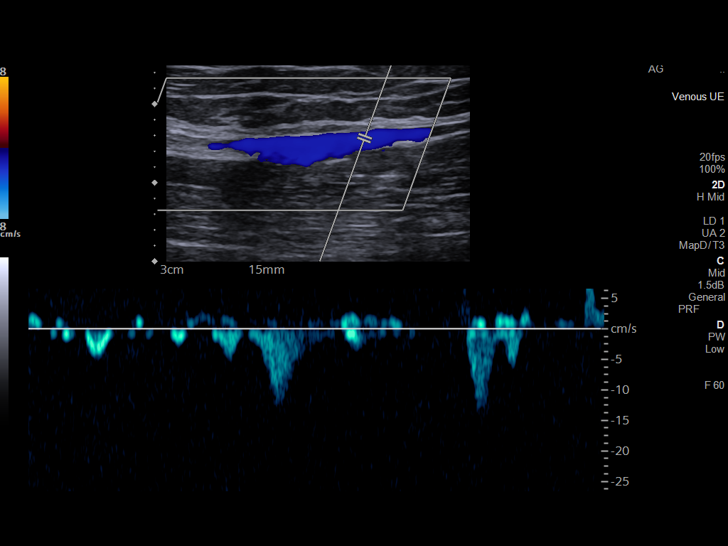

[13 of 24 positions shown; findings below may reference images not displayed]

FINDINGS: Contralateral Subclavian Vein: Respiratory phasicity is normal and
symmetric with the symptomatic side. No evidence of thrombus. Normal
compressibility.

Internal Jugular Vein: No evidence of thrombus. Normal
compressibility, respiratory phasicity and response to augmentation.

Subclavian Vein: No evidence of thrombus. Normal compressibility,
respiratory phasicity and response to augmentation.

Axillary Vein: No evidence of thrombus. Normal compressibility,
respiratory phasicity and response to augmentation.

Cephalic Vein: No evidence of thrombus. Normal compressibility,
respiratory phasicity and response to augmentation.

Basilic Vein: No evidence of thrombus. Normal compressibility,
respiratory phasicity and response to augmentation.

Brachial Veins: No evidence of thrombus. Normal compressibility,
respiratory phasicity and response to augmentation.

Radial Veins: No evidence of thrombus. Normal compressibility,
respiratory phasicity and response to augmentation.

Ulnar Veins: No evidence of thrombus. Normal compressibility,
respiratory phasicity and response to augmentation.

Venous Reflux:  None visualized.

Other Findings:  None visualized.
IMPRESSION: No evidence of DVT within the right upper extremity.
